# Patient Record
Sex: Female | Born: 1945 | Race: White | Hispanic: No | Smoking: Former smoker
Health system: Southern US, Community
[De-identification: ages and names within clinical notes are randomized; demographics above are authoritative.]

## PROBLEM LIST (undated history)

## (undated) ENCOUNTER — Ambulatory Visit: Admission: EM | Source: Home / Self Care

## (undated) DIAGNOSIS — M810 Age-related osteoporosis without current pathological fracture: Secondary | ICD-10-CM

## (undated) DIAGNOSIS — I471 Supraventricular tachycardia, unspecified: Secondary | ICD-10-CM

## (undated) DIAGNOSIS — H409 Unspecified glaucoma: Secondary | ICD-10-CM

## (undated) DIAGNOSIS — E785 Hyperlipidemia, unspecified: Secondary | ICD-10-CM

## (undated) HISTORY — PX: TUBAL LIGATION: SHX77

## (undated) HISTORY — PX: UMBILICAL HERNIA REPAIR: SHX196

## (undated) HISTORY — PX: ROTATOR CUFF REPAIR: SHX139

## (undated) HISTORY — PX: CHOLECYSTECTOMY: SHX55

---

## 2007-11-11 ENCOUNTER — Ambulatory Visit: Payer: Self-pay | Admitting: Gynecology

## 2009-01-29 ENCOUNTER — Ambulatory Visit: Payer: Self-pay | Admitting: Family Medicine

## 2010-10-24 ENCOUNTER — Ambulatory Visit: Payer: Self-pay | Admitting: Family Medicine

## 2012-08-09 ENCOUNTER — Ambulatory Visit: Payer: Self-pay | Admitting: Emergency Medicine

## 2013-03-15 ENCOUNTER — Ambulatory Visit: Payer: Self-pay

## 2014-07-17 ENCOUNTER — Ambulatory Visit: Payer: Self-pay

## 2015-04-02 ENCOUNTER — Ambulatory Visit
Admit: 2015-04-02 | Disposition: A | Discharge status: Home / Self Care | Payer: Self-pay | Attending: Family Medicine | Admitting: Family Medicine

## 2015-06-06 ENCOUNTER — Other Ambulatory Visit: Payer: Self-pay | Admitting: Family Medicine

## 2015-06-06 DIAGNOSIS — Z1231 Encounter for screening mammogram for malignant neoplasm of breast: Secondary | ICD-10-CM

## 2015-06-21 ENCOUNTER — Ambulatory Visit
Admission: RE | Admit: 2015-06-21 | Discharge: 2015-06-21 | Disposition: A | Discharge status: Home / Self Care | Payer: Medicare Other | Source: Ambulatory Visit | Attending: Family Medicine | Admitting: Family Medicine

## 2015-06-21 DIAGNOSIS — Z1231 Encounter for screening mammogram for malignant neoplasm of breast: Secondary | ICD-10-CM

## 2015-07-30 ENCOUNTER — Ambulatory Visit
Admission: EM | Admit: 2015-07-30 | Discharge: 2015-07-30 | Disposition: A | Discharge status: Home / Self Care | Payer: Medicare Other | Attending: Family Medicine | Admitting: Family Medicine

## 2015-07-30 ENCOUNTER — Ambulatory Visit: Payer: Medicare Other

## 2015-07-30 DIAGNOSIS — M7522 Bicipital tendinitis, left shoulder: Secondary | ICD-10-CM | POA: Diagnosis not present

## 2015-07-30 DIAGNOSIS — M79675 Pain in left toe(s): Secondary | ICD-10-CM | POA: Diagnosis present

## 2015-07-30 DIAGNOSIS — X58XXXA Exposure to other specified factors, initial encounter: Secondary | ICD-10-CM | POA: Insufficient documentation

## 2015-07-30 DIAGNOSIS — S46212A Strain of muscle, fascia and tendon of other parts of biceps, left arm, initial encounter: Secondary | ICD-10-CM | POA: Diagnosis not present

## 2015-07-30 DIAGNOSIS — E785 Hyperlipidemia, unspecified: Secondary | ICD-10-CM | POA: Insufficient documentation

## 2015-07-30 DIAGNOSIS — M25512 Pain in left shoulder: Secondary | ICD-10-CM

## 2015-07-30 DIAGNOSIS — H409 Unspecified glaucoma: Secondary | ICD-10-CM | POA: Diagnosis not present

## 2015-07-30 DIAGNOSIS — S46112A Strain of muscle, fascia and tendon of long head of biceps, left arm, initial encounter: Secondary | ICD-10-CM

## 2015-07-30 DIAGNOSIS — S93529A Sprain of metatarsophalangeal joint of unspecified toe(s), initial encounter: Secondary | ICD-10-CM | POA: Diagnosis not present

## 2015-07-30 DIAGNOSIS — M810 Age-related osteoporosis without current pathological fracture: Secondary | ICD-10-CM | POA: Insufficient documentation

## 2015-07-30 DIAGNOSIS — M79602 Pain in left arm: Secondary | ICD-10-CM | POA: Diagnosis present

## 2015-07-30 HISTORY — DX: Age-related osteoporosis without current pathological fracture: M81.0

## 2015-07-30 HISTORY — DX: Hyperlipidemia, unspecified: E78.5

## 2015-07-30 HISTORY — DX: Unspecified glaucoma: H40.9

## 2015-07-30 MED ORDER — NAPROXEN 500 MG PO TABS: 500.0000 mg | ORAL_TABLET | Freq: Two times a day (BID) | ORAL | Status: DC

## 2015-07-30 NOTE — Discharge Instructions (Signed)
Turf Toe, with Rehab °Injury to the base of the big toe (first metatarsal phalangeal joint) that causes damage to the joint capsule and ligaments is known as turf toe. Turf toe commonly occurs on the bottom side of the joint. °SYMPTOMS  °· Pain, tenderness, inflammation and/or bruising around the big toe (contusion). °· Pain that worsens with movement of the big toe, specifically when raising (extending) the toe. °· Inability to walk properly on the affected foot, which causes one to limp. °CAUSES  °Turf toe is caused by a force being placed on the joint capsule and ligaments that is greater than they can withstand. Common mechanisms of injury include: °· Repetitive and/or strenuous extension of the big toe (standing on tiptoes). °· Explosive running starts (sprinters). °· "Stubbing" the big toe. °· Another player landing on your foot. °RISK INCREASES WITH: °· Previous toe injury. °· Having a long first toe. °· Flat feet. °· Arthritis of the great toe. °· Improperly fitted shoes or shoes that are not appropriate for a given activity. °· Family history of foot abnormalities. °· Activities that involve explosive running starts, standing on tiptoes, or jumping. °PREVENTION °· Wear properly fitted shoes that are appropriate for the sport or activity. °· Protect the first toe by taping it to reduce motion. °· Maintain physical fitness: °¨ Strength, flexibility, and endurance. °¨ Cardiovascular fitness. °PROGNOSIS  °If treated properly, the symptoms of turf toe usually resolve with non-surgical (conservative) treatment. Occasionally, surgery is necessary. °RELATED COMPLICATIONS °· Recurrent symptoms that result in a chronic problem. °· Inability to compete in athletics. °· Prolonged healing time, if improperly treated or re-injured. °· Other foot injuries that occur due to protecting the first toe from pain. °· Loss of motion in the first toe (hallux rigidus). °· Bunion (hallux valgus). °TREATMENT  °Treatment initially  involves resting from any activities that aggravate the symptoms, and the use of ice and medications to help reduce pain and inflammation. The use of range-of-motion exercises may help reduce pain with activity. It is important that you wear properly fitted shoes with a stiff sole and a wide toe box, in order to reduce the pressure on the first toe. Protecting your big toe by taping it to restrict movement may allow you to return to sports earlier without pain or discomfort. If the condition becomes chronic, then your caregiver may recommend a corticosteroid injection to help reduce inflammation. If symptoms persist despite non-surgical treatment, then surgery may be recommended. °MEDICATION °· If pain medication is necessary, then nonsteroidal anti-inflammatory medications, such as aspirin and ibuprofen, or other minor pain relievers, such as acetaminophen, are often recommended. °· Do not take pain medication for 7 days before surgery. °· Prescription pain relievers may be given if deemed necessary by your caregiver. Use only as directed and only as much as you need. °· Ointments applied to the skin may be helpful. °· Corticosteroid injections may be given by your caregiver. These injections should be reserved for the most serious cases, because they may only be given a certain number of times. °HEAT AND COLD °· Cold treatment (icing) relieves pain and reduces inflammation. Cold treatment should be applied for 10 to 15 minutes every 2 to 3 hours for inflammation and pain and immediately after any activity that aggravates your symptoms. Use ice packs or massage the area with a piece of ice (ice massage). °· Heat treatment may be used prior to performing the stretching and strengthening activities prescribed by your caregiver, physical therapist, or athletic   trainer. Use a heat pack or soak the injury in warm water. SEEK MEDICAL CARE IF:  Treatment seems to offer no benefit, or the condition worsens.  Any  medications produce adverse side effects. EXERCISES RANGE OF MOTION (ROM) AND STRETCHING EXERCISES - Turf Toe These exercises may help you when beginning to rehabilitate your injury. Your symptoms may resolve with or without further involvement from your physician, physical therapist, or athletic trainer. While completing these exercises, remember:  Restoring tissue flexibility helps normal motion to return to the joints. This allows healthier, less painful movement and activity.  An effective stretch should be held for at least 30 seconds.  A stretch should never be painful. You should only feel a gentle lengthening or release in the stretched tissue. RANGE OF MOTION - Toe Extension, Flexion  Sit with your right / left leg crossed over your opposite knee.  Grasp your toes and gently pull them back toward the top of your foot. You should feel a stretch on the bottom of your toes and/or foot.  Hold this stretch for __________ seconds.  Now, gently pull your toes toward the bottom of your foot. You should feel a stretch on the top of your toes and or foot.  Hold this stretch for __________ seconds. Repeat __________ times. Complete this stretch __________ times per day. RANGE OF MOTION - Ankle Plantar Flexion  Sit with your right / left leg crossed over your opposite knee.  Use your opposite hand to pull the top of your foot and toes toward you.  You should feel a gentle stretch on the top of your foot/ankle. Hold this position for __________ seconds. Repeat __________ times. Complete __________ times per day. STRENGTHENING EXERCISES - Turf Toe These exercises may help you when beginning to rehabilitate your injury. They may resolve your symptoms with or without further involvement from your physician, physical therapist, or athletic trainer. While completing these exercises, remember:  Muscles can gain both the endurance and the strength needed for everyday activities through  controlled exercises.  Complete these exercises as instructed by your physician, physical therapist, or athletic trainer. Progress with the resistance and repetition exercises only as your caregiver advises.  You may experience muscle soreness or fatigue, but the pain or discomfort you are trying to eliminate should never worsen during these exercises. If this pain does worsen, stop and make certain you are following the directions exactly. If the pain is still present after adjustments, discontinue the exercise until you can discuss the trouble with your clinician. STRENGTH - Towel Curls  Sit in a chair positioned on a non-carpeted surface.  Place your foot on a towel, keeping your heel on the floor.  Pull the towel toward your heel by only curling your toes. Keep your heel on the floor.  If instructed by your physician, physical therapist or athletic trainer, add ____________________ at the end of the towel. Repeat __________ times. Complete this exercise __________ times per day. Document Released: 12/15/2005 Document Revised: 05/01/2014 Document Reviewed: 03/29/2009 [facility] Patient Information 2015 [place], [place]. This information is not intended to replace advice given to you by your health care provider. Make sure you discuss any questions you have with your health care provider. Impingement Syndrome, Rotator Cuff, Bursitis with Rehab Impingement syndrome is a condition that involves inflammation of the tendons of the rotator cuff and the subacromial bursa, that causes pain in the shoulder. The rotator cuff consists of four tendons and muscles that control much of the shoulder and  upper arm function. The subacromial bursa is a fluid filled sac that helps reduce friction between the rotator cuff and one of the bones of the shoulder (acromion). Impingement syndrome is usually an overuse injury that causes swelling of the bursa (bursitis), swelling of the tendon (tendonitis), and/or a tear of  the tendon (strain). Strains are classified into three categories. Grade 1 strains cause pain, but the tendon is not lengthened. Grade 2 strains include a lengthened ligament, due to the ligament being stretched or partially ruptured. With grade 2 strains there is still function, although the function may be decreased. Grade 3 strains include a complete tear of the tendon or muscle, and function is usually impaired. SYMPTOMS   Pain around the shoulder, often at the outer portion of the upper arm.  Pain that gets worse with shoulder function, especially when reaching overhead or lifting.  Sometimes, aching when not using the arm.  Pain that wakes you up at night.  Sometimes, tenderness, swelling, warmth, or redness over the affected area.  Loss of strength.  Limited motion of the shoulder, especially reaching behind the back (to the back pocket or to unhook bra) or across your body.  Crackling sound (crepitation) when moving the arm.  Biceps tendon pain and inflammation (in the front of the shoulder). Worse when bending the elbow or lifting. CAUSES  Impingement syndrome is often an overuse injury, in which chronic (repetitive) motions cause the tendons or bursa to become inflamed. A strain occurs when a force is paced on the tendon or muscle that is greater than it can withstand. Common mechanisms of injury include: Stress from sudden increase in duration, frequency, or intensity of training.  Direct hit (trauma) to the shoulder.  Aging, erosion of the tendon with normal use.  Bony bump on shoulder (acromial spur). RISK INCREASES WITH:  Contact sports (football, wrestling, boxing).  Throwing sports (baseball, tennis, volleyball).  Weightlifting and bodybuilding.  Heavy labor.  Previous injury to the rotator cuff, including impingement.  Poor shoulder strength and flexibility.  Failure to warm up properly before activity.  Inadequate protective equipment.  Old  age.  Bony bump on shoulder (acromial spur). PREVENTION   Warm up and stretch properly before activity.  Allow for adequate recovery between workouts.  Maintain physical fitness:  Strength, flexibility, and endurance.  Cardiovascular fitness.  Learn and use proper exercise technique. PROGNOSIS  If treated properly, impingement syndrome usually goes away within 6 weeks. Sometimes surgery is required.  RELATED COMPLICATIONS   Longer healing time if not properly treated, or if not given enough time to heal.  Recurring symptoms, that result in a chronic condition.  Shoulder stiffness, frozen shoulder, or loss of motion.  Rotator cuff tendon tear.  Recurring symptoms, especially if activity is resumed too soon, with overuse, with a direct blow, or when using poor technique. TREATMENT  Treatment first involves the use of ice and medicine, to reduce pain and inflammation. The use of strengthening and stretching exercises may help reduce pain with activity. These exercises may be performed at home or with a therapist. If non-surgical treatment is unsuccessful after more than 6 months, surgery may be advised. After surgery and rehabilitation, activity is usually possible in 3 months.  MEDICATION  If pain medicine is needed, nonsteroidal anti-inflammatory medicines (aspirin and ibuprofen), or other minor pain relievers (acetaminophen), are often advised.  Do not take pain medicine for 7 days before surgery.  Prescription pain relievers may be given, if your caregiver thinks they are  needed. Use only as directed and only as much as you need.  Corticosteroid injections may be given by your caregiver. These injections should be reserved for the most serious cases, because they may only be given a certain number of times. HEAT AND COLD  Cold treatment (icing) should be applied for 10 to 15 minutes every 2 to 3 hours for inflammation and pain, and immediately after activity that aggravates  your symptoms. Use ice packs or an ice massage.  Heat treatment may be used before performing stretching and strengthening activities prescribed by your caregiver, physical therapist, or athletic trainer. Use a heat pack or a warm water soak. SEEK MEDICAL CARE IF:   Symptoms get worse or do not improve in 4 to 6 weeks, despite treatment.  New, unexplained symptoms develop. (Drugs used in treatment may produce side effects.) EXERCISES  RANGE OF MOTION (ROM) AND STRETCHING EXERCISES - Impingement Syndrome (Rotator Cuff  Tendinitis, Bursitis) These exercises may help you when beginning to rehabilitate your injury. Your symptoms may go away with or without further involvement from your physician, physical therapist or athletic trainer. While completing these exercises, remember:   Restoring tissue flexibility helps normal motion to return to the joints. This allows healthier, less painful movement and activity.  An effective stretch should be held for at least 30 seconds.  A stretch should never be painful. You should only feel a gentle lengthening or release in the stretched tissue. STRETCH - Flexion, Standing  Stand with good posture. With an underhand grip on your right / left hand, and an overhand grip on the opposite hand, grasp a broomstick or cane so that your hands are a little more than shoulder width apart.  Keeping your right / left elbow straight and shoulder muscles relaxed, push the stick with your opposite hand, to raise your right / left arm in front of your body and then overhead. Raise your arm until you feel a stretch in your right / left shoulder, but before you have increased shoulder pain.  Try to avoid shrugging your right / left shoulder as your arm rises, by keeping your shoulder blade tucked down and toward your mid-back spine. Hold for __________ seconds.  Slowly return to the starting position. Repeat __________ times. Complete this exercise __________ times per  day. STRETCH - Abduction, Supine  Lie on your back. With an underhand grip on your right / left hand and an overhand grip on the opposite hand, grasp a broomstick or cane so that your hands are a little more than shoulder width apart.  Keeping your right / left elbow straight and your shoulder muscles relaxed, push the stick with your opposite hand, to raise your right / left arm out to the side of your body and then overhead. Raise your arm until you feel a stretch in your right / left shoulder, but before you have increased shoulder pain.  Try to avoid shrugging your right / left shoulder as your arm rises, by keeping your shoulder blade tucked down and toward your mid-back spine. Hold for __________ seconds.  Slowly return to the starting position. Repeat __________ times. Complete this exercise __________ times per day. ROM - Flexion, Active-Assisted  Lie on your back. You may bend your knees for comfort.  Grasp a broomstick or cane so your hands are about shoulder width apart. Your right / left hand should grip the end of the stick, so that your hand is positioned "thumbs-up," as if you were about to  shake hands.  Using your healthy arm to lead, raise your right / left arm overhead, until you feel a gentle stretch in your shoulder. Hold for __________ seconds.  Use the stick to assist in returning your right / left arm to its starting position. Repeat __________ times. Complete this exercise __________ times per day.  ROM - Internal Rotation, Supine   Lie on your back on a firm surface. Place your right / left elbow about 60 degrees away from your side. Elevate your elbow with a folded towel, so that the elbow and shoulder are the same height.  Using a broomstick or cane and your strong arm, pull your right / left hand toward your body until you feel a gentle stretch, but no increase in your shoulder pain. Keep your shoulder and elbow in place throughout the exercise.  Hold for  __________ seconds. Slowly return to the starting position. Repeat __________ times. Complete this exercise __________ times per day. STRETCH - Internal Rotation  Place your right / left hand behind your back, palm up.  Throw a towel or belt over your opposite shoulder. Grasp the towel with your right / left hand.  While keeping an upright posture, gently pull up on the towel, until you feel a stretch in the front of your right / left shoulder.  Avoid shrugging your right / left shoulder as your arm rises, by keeping your shoulder blade tucked down and toward your mid-back spine.  Hold for __________ seconds. Release the stretch, by lowering your healthy hand. Repeat __________ times. Complete this exercise __________ times per day. ROM - Internal Rotation   Using an underhand grip, grasp a stick behind your back with both hands.  While standing upright with good posture, slide the stick up your back until you feel a mild stretch in the front of your shoulder.  Hold for __________ seconds. Slowly return to your starting position. Repeat __________ times. Complete this exercise __________ times per day.  STRETCH - Posterior Shoulder Capsule   Stand or sit with good posture. Grasp your right / left elbow and draw it across your chest, keeping it at the same height as your shoulder.  Pull your elbow, so your upper arm comes in closer to your chest. Pull until you feel a gentle stretch in the back of your shoulder.  Hold for __________ seconds. Repeat __________ times. Complete this exercise __________ times per day. STRENGTHENING EXERCISES - Impingement Syndrome (Rotator Cuff Tendinitis, Bursitis) These exercises may help you when beginning to rehabilitate your injury. They may resolve your symptoms with or without further involvement from your physician, physical therapist or athletic trainer. While completing these exercises, remember:  Muscles can gain both the endurance and the  strength needed for everyday activities through controlled exercises.  Complete these exercises as instructed by your physician, physical therapist or athletic trainer. Increase the resistance and repetitions only as guided.  You may experience muscle soreness or fatigue, but the pain or discomfort you are trying to eliminate should never worsen during these exercises. If this pain does get worse, stop and make sure you are following the directions exactly. If the pain is still present after adjustments, discontinue the exercise until you can discuss the trouble with your clinician.  During your recovery, avoid activity or exercises which involve actions that place your injured hand or elbow above your head or behind your back or head. These positions stress the tissues which you are trying to heal. STRENGTH - Scapular  Depression and Adduction   With good posture, sit on a firm chair. Support your arms in front of you, with pillows, arm rests, or on a table top. Have your elbows in line with the sides of your body.  Gently draw your shoulder blades down and toward your mid-back spine. Gradually increase the tension, without tensing the muscles along the top of your shoulders and the back of your neck.  Hold for __________ seconds. Slowly release the tension and relax your muscles completely before starting the next repetition.  After you have practiced this exercise, remove the arm support and complete the exercise in standing as well as sitting position. Repeat __________ times. Complete this exercise __________ times per day.  STRENGTH - Shoulder Abductors, Isometric  With good posture, stand or sit about 4-6 inches from a wall, with your right / left side facing the wall.  Bend your right / left elbow. Gently press your right / left elbow into the wall. Increase the pressure gradually, until you are pressing as hard as you can, without shrugging your shoulder or increasing any shoulder  discomfort.  Hold for __________ seconds.  Release the tension slowly. Relax your shoulder muscles completely before you begin the next repetition. Repeat __________ times. Complete this exercise __________ times per day.  STRENGTH - External Rotators, Isometric  Keep your right / left elbow at your side and bend it 90 degrees.  Step into a door frame so that the outside of your right / left wrist can press against the door frame without your upper arm leaving your side.  Gently press your right / left wrist into the door frame, as if you were trying to swing the back of your hand away from your stomach. Gradually increase the tension, until you are pressing as hard as you can, without shrugging your shoulder or increasing any shoulder discomfort.  Hold for __________ seconds.  Release the tension slowly. Relax your shoulder muscles completely before you begin the next repetition. Repeat __________ times. Complete this exercise __________ times per day.  STRENGTH - Supraspinatus   Stand or sit with good posture. Grasp a __________ weight, or an exercise band or tubing, so that your hand is "thumbs-up," like you are shaking hands.  Slowly lift your right / left arm in a "V" away from your thigh, diagonally into the space between your side and straight ahead. Lift your hand to shoulder height or as far as you can, without increasing any shoulder pain. At first, many people do not lift their hands above shoulder height.  Avoid shrugging your right / left shoulder as your arm rises, by keeping your shoulder blade tucked down and toward your mid-back spine.  Hold for __________ seconds. Control the descent of your hand, as you slowly return to your starting position. Repeat __________ times. Complete this exercise __________ times per day.  STRENGTH - External Rotators  Secure a rubber exercise band or tubing to a fixed object (table, pole) so that it is at the same height as your right /  left elbow when you are standing or sitting on a firm surface.  Stand or sit so that the secured exercise band is at your uninjured side.  Bend your right / left elbow 90 degrees. Place a folded towel or small pillow under your right / left arm, so that your elbow is a few inches away from your side.  Keeping the tension on the exercise band, pull it away from your body,  as if pivoting on your elbow. Be sure to keep your body steady, so that the movement is coming only from your rotating shoulder.  Hold for __________ seconds. Release the tension in a controlled manner, as you return to the starting position. Repeat __________ times. Complete this exercise __________ times per day.  STRENGTH - Internal Rotators   Secure a rubber exercise band or tubing to a fixed object (table, pole) so that it is at the same height as your right / left elbow when you are standing or sitting on a firm surface.  Stand or sit so that the secured exercise band is at your right / left side.  Bend your elbow 90 degrees. Place a folded towel or small pillow under your right / left arm so that your elbow is a few inches away from your side.  Keeping the tension on the exercise band, pull it across your body, toward your stomach. Be sure to keep your body steady, so that the movement is coming only from your rotating shoulder.  Hold for __________ seconds. Release the tension in a controlled manner, as you return to the starting position. Repeat __________ times. Complete this exercise __________ times per day.  STRENGTH - Scapular Protractors, Standing   Stand arms length away from a wall. Place your hands on the wall, keeping your elbows straight.  Begin by dropping your shoulder blades down and toward your mid-back spine.  To strengthen your protractors, keep your shoulder blades down, but slide them forward on your rib cage. It will feel as if you are lifting the back of your rib cage away from the wall. This  is a subtle motion and can be challenging to complete. Ask your caregiver for further instruction, if you are not sure you are doing the exercise correctly.  Hold for __________ seconds. Slowly return to the starting position, resting the muscles completely before starting the next repetition. Repeat __________ times. Complete this exercise __________ times per day. STRENGTH - Scapular Protractors, Supine  Lie on your back on a firm surface. Extend your right / left arm straight into the air while holding a __________ weight in your hand.  Keeping your head and back in place, lift your shoulder off the floor.  Hold for __________ seconds. Slowly return to the starting position, and allow your muscles to relax completely before starting the next repetition. Repeat __________ times. Complete this exercise __________ times per day. STRENGTH - Scapular Protractors, Quadruped  Get onto your hands and knees, with your shoulders directly over your hands (or as close as you can be, comfortably).  Keeping your elbows locked, lift the back of your rib cage up into your shoulder blades, so your mid-back rounds out. Keep your neck muscles relaxed.  Hold this position for __________ seconds. Slowly return to the starting position and allow your muscles to relax completely before starting the next repetition. Repeat __________ times. Complete this exercise __________ times per day.  STRENGTH - Scapular Retractors  Secure a rubber exercise band or tubing to a fixed object (table, pole), so that it is at the height of your shoulders when you are either standing, or sitting on a firm armless chair.  With a palm down grip, grasp an end of the band in each hand. Straighten your elbows and lift your hands straight in front of you, at shoulder height. Step back, away from the secured end of the band, until it becomes tense.  Squeezing your shoulder blades together,  draw your elbows back toward your sides, as  you bend them. Keep your upper arms lifted away from your body throughout the exercise.  Hold for __________ seconds. Slowly ease the tension on the band, as you reverse the directions and return to the starting position. Repeat __________ times. Complete this exercise __________ times per day. STRENGTH - Shoulder Extensors   Secure a rubber exercise band or tubing to a fixed object (table, pole) so that it is at the height of your shoulders when you are either standing, or sitting on a firm armless chair.  With a thumbs-up grip, grasp an end of the band in each hand. Straighten your elbows and lift your hands straight in front of you, at shoulder height. Step back, away from the secured end of the band, until it becomes tense.  Squeezing your shoulder blades together, pull your hands down to the sides of your thighs. Do not allow your hands to go behind you.  Hold for __________ seconds. Slowly ease the tension on the band, as you reverse the directions and return to the starting position. Repeat __________ times. Complete this exercise __________ times per day.  STRENGTH - Scapular Retractors and External Rotators   Secure a rubber exercise band or tubing to a fixed object (table, pole) so that it is at the height as your shoulders, when you are either standing, or sitting on a firm armless chair.  With a palm down grip, grasp an end of the band in each hand. Bend your elbows 90 degrees and lift your elbows to shoulder height, at your sides. Step back, away from the secured end of the band, until it becomes tense.  Squeezing your shoulder blades together, rotate your shoulders so that your upper arms and elbows remain stationary, but your fists travel upward to head height.  Hold for __________ seconds. Slowly ease the tension on the band, as you reverse the directions and return to the starting position. Repeat __________ times. Complete this exercise __________ times per day.  STRENGTH -  Scapular Retractors and External Rotators, Rowing   Secure a rubber exercise band or tubing to a fixed object (table, pole) so that it is at the height of your shoulders, when you are either standing, or sitting on a firm armless chair.  With a palm down grip, grasp an end of the band in each hand. Straighten your elbows and lift your hands straight in front of you, at shoulder height. Step back, away from the secured end of the band, until it becomes tense.  Step 1: Squeeze your shoulder blades together. Bending your elbows, draw your hands to your chest, as if you are rowing a boat. At the end of this motion, your hands and elbow should be at shoulder height and your elbows should be out to your sides.  Step 2: Rotate your shoulders, to raise your hands above your head. Your forearms should be vertical and your upper arms should be horizontal.  Hold for __________ seconds. Slowly ease the tension on the band, as you reverse the directions and return to the starting position. Repeat __________ times. Complete this exercise __________ times per day.  STRENGTH - Scapular Depressors  Find a sturdy chair without wheels, such as a dining room chair.  Keeping your feet on the floor, and your hands on the chair arms, lift your bottom up from the seat, and lock your elbows.  Keeping your elbows straight, allow gravity to pull your body weight down.  Your shoulders will rise toward your ears.  Raise your body against gravity by drawing your shoulder blades down your back, shortening the distance between your shoulders and ears. Although your feet should always maintain contact with the floor, your feet should progressively support less body weight, as you get stronger.  Hold for __________ seconds. In a controlled and slow manner, lower your body weight to begin the next repetition. Repeat __________ times. Complete this exercise __________ times per day.  Document Released: 12/15/2005 Document  Revised: 03/08/2012 Document Reviewed: 03/29/2009 [facility] Patient Information 2015 [place], [place]. This information is not intended to replace advice given to you by your health care provider. Make sure you discuss any questions you have with your health care provider. Shoulder Pain The shoulder is the joint that connects your arms to your body. The bones that form the shoulder joint include the upper arm bone (humerus), the shoulder blade (scapula), and the collarbone (clavicle). The top of the humerus is shaped like a ball and fits into a rather flat socket on the scapula (glenoid cavity). A combination of muscles and strong, fibrous tissues that connect muscles to bones (tendons) support your shoulder joint and hold the ball in the socket. Small, fluid-filled sacs (bursae) are located in different areas of the joint. They act as cushions between the bones and the overlying soft tissues and help reduce friction between the gliding tendons and the bone as you move your arm. Your shoulder joint allows a wide range of motion in your arm. This range of motion allows you to do things like scratch your back or throw a ball. However, this range of motion also makes your shoulder more prone to pain from overuse and injury. Causes of shoulder pain can originate from both injury and overuse and usually can be grouped in the following four categories:  Redness, swelling, and pain (inflammation) of the tendon (tendinitis) or the bursae (bursitis).  Instability, such as a dislocation of the joint.  Inflammation of the joint (arthritis).  Broken bone (fracture). HOME CARE INSTRUCTIONS   Apply ice to the sore area.  Put ice in a plastic bag.  Place a towel between your skin and the bag.  Leave the ice on for 15-20 minutes, 3-4 times per day for the first 2 days, or as directed by your health care provider.  Stop using cold packs if they do not help with the pain.  If you have a shoulder sling or  immobilizer, wear it as long as your caregiver instructs. Only remove it to shower or bathe. Move your arm as little as possible, but keep your hand moving to prevent swelling.  Squeeze a soft ball or foam pad as much as possible to help prevent swelling.  Only take over-the-counter or prescription medicines for pain, discomfort, or fever as directed by your caregiver. SEEK MEDICAL CARE IF:   Your shoulder pain increases, or new pain develops in your arm, hand, or fingers.  Your hand or fingers become cold and numb.  Your pain is not relieved with medicines. SEEK IMMEDIATE MEDICAL CARE IF:   Your arm, hand, or fingers are numb or tingling.  Your arm, hand, or fingers are significantly swollen or turn white or blue. MAKE SURE YOU:   Understand these instructions.  Will watch your condition.  Will get help right away if you are not doing well or get worse. Document Released: 09/24/2005 Document Revised: 05/01/2014 Document Reviewed: 11/29/2011 [facility] Patient Information 2015 [place], [place]. This information  is not intended to replace advice given to you by your health care provider. Make sure you discuss any questions you have with your health care provider. Bicipital Tendonitis Bicipital tendonitis refers to redness, soreness, and swelling (inflammation) or irritation of the bicep tendon. The biceps muscle is located between the elbow and shoulder of the inner arm. The tendon heads, similar to pieces of rope, connect the bicep muscle to the shoulder socket. They are called short head and long head tendons. When tendonitis occurs, the long head tendon is inflamed and swollen, and may be thickened or partially torn.  Bicipital tendonitis can occur with other problems as well, such as arthritis in the shoulder or acromioclavicular joints, tears in the tendons, or other rotator cuff problems.  CAUSES  Overuse of of the arms for overhead activities is the major cause of tendonitis. Many  athletes, such as swimmers, baseball players, and tennis players are prone to bicipital tendonitis. Jobs that require manual labor or routine chores, especially chores involving overhead activities can result in overuse and tendonitis. SYMPTOMS Symptoms may include:  Pain in and around the front of the shoulder. Pain may be worse with overhead motion.  Pain or aching that radiates down the arm.  Clicking or shifting sensations in the shoulder. DIAGNOSIS Your caregiver may perform the following:  Physical exam and tests of the biceps and shoulder to observe range of motion, strength, and stability.  X-rays or magnetic resonance imaging (MRI) to confirm the diagnosis. In most common cases, these tests are not necessary. Since other problems may exist in the shoulder or rotator cuff, additional tests may be recommended. TREATMENT Treatment may include the following:  Medications  Your caregiver may prescribe over-the-counter pain relievers.  Steroid injections, such as cortisone, may be recommended. These may help to reduce inflammation and pain.  Physical Therapy - Your caregiver may recommend gentle exercises with the arm. These can help restore strength and range of motion. They may be done at home or with a physical therapist's supervision and input.  Surgery - Arthroscopic or open surgery sometimes is necessary. Surgery may include:  Reattachment or repair of the tendon at the shoulder socket.  Removal of the damaged section of the tendon.  Anchoring the tendon to a different area of the shoulder (tenodesis). HOME CARE INSTRUCTIONS   Avoid overhead motion of the affected arm or any other motion that causes pain.  Take medication for pain as directed. Do not take these for more than 3 weeks, unless directed to do so by your caregiver.  Ice the affected area for 20 minutes at a time, 3-4 times per day. Place a towel on the skin over the painful area and the ice or cold pack  over the towel. Do not place ice directly on the skin.  Perform gentle exercises at home as directed. These will increase strength and flexibility. PREVENTION  Modify your activities as much as possible to protect your arm. A physical therapist or sports medicine physician can help you understand options for safe motion.  Avoid repetitive overhead pulling, lifting, reaching, and throwing until your caregiver tells you it is ok to resume these activities. SEEK MEDICAL CARE IF:  Your pain worsens.  You have difficulty moving the affected arm.  You have trouble performing any of the self-care instructions. MAKE SURE YOU:   Understand these instructions.  Will watch your condition.  Will get help right away if you are not doing well or get worse. Document Released: 01/17/2011  Document Revised: 03/08/2012 Document Reviewed: 01/17/2011 [facility] Patient Information 2015 [place], [place]. This information is not intended to replace advice given to you by your health care provider. Make sure you discuss any questions you have with your health care provider. Turf Toe, with Rehab Injury to the base of the big toe (first metatarsal phalangeal joint) that causes damage to the joint capsule and ligaments is known as turf toe. Turf toe commonly occurs on the bottom side of the joint. SYMPTOMS   Pain, tenderness, inflammation and/or bruising around the big toe (contusion).  Pain that worsens with movement of the big toe, specifically when raising (extending) the toe.  Inability to walk properly on the affected foot, which causes one to limp. CAUSES  Turf toe is caused by a force being placed on the joint capsule and ligaments that is greater than they can withstand. Common mechanisms of injury include:  Repetitive and/or strenuous extension of the big toe (standing on tiptoes).  Explosive running starts (sprinters).  "Stubbing" the big toe.  Another player landing on your foot. RISK  INCREASES WITH:  Previous toe injury.  Having a long first toe.  Flat feet.  Arthritis of the great toe.  Improperly fitted shoes or shoes that are not appropriate for a given activity.  Family history of foot abnormalities.  Activities that involve explosive running starts, standing on tiptoes, or jumping. PREVENTION  Wear properly fitted shoes that are appropriate for the sport or activity.  Protect the first toe by taping it to reduce motion.  Maintain physical fitness:  Strength, flexibility, and endurance.  Cardiovascular fitness. PROGNOSIS  If treated properly, the symptoms of turf toe usually resolve with non-surgical (conservative) treatment. Occasionally, surgery is necessary. RELATED COMPLICATIONS  Recurrent symptoms that result in a chronic problem.  Inability to compete in athletics.  Prolonged healing time, if improperly treated or re-injured.  Other foot injuries that occur due to protecting the first toe from pain.  Loss of motion in the first toe (hallux rigidus).  Bunion (hallux valgus). TREATMENT  Treatment initially involves resting from any activities that aggravate the symptoms, and the use of ice and medications to help reduce pain and inflammation. The use of range-of-motion exercises may help reduce pain with activity. It is important that you wear properly fitted shoes with a stiff sole and a wide toe box, in order to reduce the pressure on the first toe. Protecting your big toe by taping it to restrict movement may allow you to return to sports earlier without pain or discomfort. If the condition becomes chronic, then your caregiver may recommend a corticosteroid injection to help reduce inflammation. If symptoms persist despite non-surgical treatment, then surgery may be recommended. MEDICATION  If pain medication is necessary, then nonsteroidal anti-inflammatory medications, such as aspirin and ibuprofen, or other minor pain relievers, such as  acetaminophen, are often recommended.  Do not take pain medication for 7 days before surgery.  Prescription pain relievers may be given if deemed necessary by your caregiver. Use only as directed and only as much as you need.  Ointments applied to the skin may be helpful.  Corticosteroid injections may be given by your caregiver. These injections should be reserved for the most serious cases, because they may only be given a certain number of times. HEAT AND COLD  Cold treatment (icing) relieves pain and reduces inflammation. Cold treatment should be applied for 10 to 15 minutes every 2 to 3 hours for inflammation and pain and immediately after  any activity that aggravates your symptoms. Use ice packs or massage the area with a piece of ice (ice massage).  Heat treatment may be used prior to performing the stretching and strengthening activities prescribed by your caregiver, physical therapist, or athletic trainer. Use a heat pack or soak the injury in warm water. SEEK MEDICAL CARE IF:  Treatment seems to offer no benefit, or the condition worsens.  Any medications produce adverse side effects. EXERCISES RANGE OF MOTION (ROM) AND STRETCHING EXERCISES - Turf Toe These exercises may help you when beginning to rehabilitate your injury. Your symptoms may resolve with or without further involvement from your physician, physical therapist, or athletic trainer. While completing these exercises, remember:  Restoring tissue flexibility helps normal motion to return to the joints. This allows healthier, less painful movement and activity.  An effective stretch should be held for at least 30 seconds.  A stretch should never be painful. You should only feel a gentle lengthening or release in the stretched tissue. RANGE OF MOTION - Toe Extension, Flexion  Sit with your right / left leg crossed over your opposite knee.  Grasp your toes and gently pull them back toward the top of your foot. You  should feel a stretch on the bottom of your toes and/or foot.  Hold this stretch for __________ seconds.  Now, gently pull your toes toward the bottom of your foot. You should feel a stretch on the top of your toes and or foot.  Hold this stretch for __________ seconds. Repeat __________ times. Complete this stretch __________ times per day. RANGE OF MOTION - Ankle Plantar Flexion  Sit with your right / left leg crossed over your opposite knee.  Use your opposite hand to pull the top of your foot and toes toward you.  You should feel a gentle stretch on the top of your foot/ankle. Hold this position for __________ seconds. Repeat __________ times. Complete __________ times per day. STRENGTHENING EXERCISES - Turf Toe These exercises may help you when beginning to rehabilitate your injury. They may resolve your symptoms with or without further involvement from your physician, physical therapist, or athletic trainer. While completing these exercises, remember:  Muscles can gain both the endurance and the strength needed for everyday activities through controlled exercises.  Complete these exercises as instructed by your physician, physical therapist, or athletic trainer. Progress with the resistance and repetition exercises only as your caregiver advises.  You may experience muscle soreness or fatigue, but the pain or discomfort you are trying to eliminate should never worsen during these exercises. If this pain does worsen, stop and make certain you are following the directions exactly. If the pain is still present after adjustments, discontinue the exercise until you can discuss the trouble with your clinician. STRENGTH - Towel Curls  Sit in a chair positioned on a non-carpeted surface.  Place your foot on a towel, keeping your heel on the floor.  Pull the towel toward your heel by only curling your toes. Keep your heel on the floor.  If instructed by your physician, physical therapist  or athletic trainer, add ____________________ at the end of the towel. Repeat __________ times. Complete this exercise __________ times per day. Document Released: 12/15/2005 Document Revised: 05/01/2014 Document Reviewed: 03/29/2009 [facility] Patient Information 2015 [place], [place]. This information is not intended to replace advice given to you by your health care provider. Make sure you discuss any questions you have with your health care provider.  PrintEmail  Patient information: Osteoporosis (The Basics)       View All  Foods and drinks with calcium and vitamin D  Foods and drinks with calcium   The Basics  Patient information: Calcium and vitamin D for bone health (The Basics) Patient information: Fractures (The Basics) Patient information: Hip fracture (The Basics) Patient information: Medicines for osteoporosis (The Basics) Patient information: Vertebral compression fracture (The Basics) Beyond the Basics  Patient information: Bone density testing (Beyond the Basics) Patient information: Calcium and vitamin D for bone health (Beyond the Basics) Patient information: Osteoporosis prevention and treatment (Beyond the Basics) Patient information: Primary hyperparathyroidism (Beyond the Basics) Patient information: Vitamin D deficiency (Beyond the Basics) Patient information: Osteoporosis (The Basics) View in Spanish  Written by the doctors and editors at [employer]  What is osteoporosis? -- Osteoporosis is a disease that makes your bones weak. People with the disease can break their bones too easily. For instance, people with osteoporosis sometimes break a bone after falling down at home.  Breaking a bone can be serious, especially if the bone is in the hip. People who break a hip sometimes lose the ability to walk on their own. Many of them end up in a nursing home. Thats why it is so important to avoid breaking a bone in the first place.  How do I know if I have  osteoporosis? -- Osteoporosis does not cause symptoms until you break a bone. But your doctor or nurse can have you tested for it. The best test is a bone density test called the DXA test. It is a special kind of X-ray. Experts recommend bone density testing for women older than 65. That is because women in this age group have the highest risk of osteoporosis. Still, other people should sometimes be tested, too. Ask your doctor or nurse if you should be tested.  Some people learn that they have osteoporosis because they break a bone during a fall or a mild impact. This is called a fragility fracture, because people with healthy bones should not break a bone that easily. People who have fragility fractures are at high risk of having other bones break.  What can I do to keep my bones as healthy as possible? -- You can: ?Eat foods with a lot of calcium, such as milk, yogurt, and green leafy vegetables (table 1 and figure 1) ?Eat foods with a lot of vitamin D, such as milk that has vitamin D added, and fish from the ocean  ?Take calcium and vitamin D pills (if you do not get enough from the food that you eat) ?Be active for at least 30 minutes, most days of the week ?Avoid smoking  ?Limit the amount of alcohol you drink to 1 to 2 drinks a day at most Do your best to keep from falling, too -- It sounds simple, but you can prevent a lot of fractures by reducing the chances of a fall. To do that: ?Make sure all your rugs have a no-slip backing to keep them in place ?Tuck away any electrical cords, so they are not in your way ?Light all walkways well ?Watch out for slippery floors ?Wear sturdy, comfortable shoes with rubber soles  ?Have your eyes checked ?Ask your doctor or nurse to check whether any of your medicines might make you dizzy or increase your risk of falling  Can osteoporosis be treated? -- Yes, there are a few medicines to treat osteoporosis. These medicines can reduce the chances that  you  will break a bone.  Doctors and nurses usually suggest trying medicines called bisphosphonates first. If those medicines do not do enough or if they cause side effects that you cannot stand, there are other medicines to try. How will I know the treatment is working? -- Chiropractor and nurses often order bone density tests to check if osteoporosis medicines are working. These are the same tests they use to find osteoporosis in the first place. Sometimes a blood or urine test is also needed. PrintEmail     Patient information: Osteoporosis (The Basics)       View All  Foods and drinks with calcium and vitamin D  Foods and drinks with calcium   The Basics  Patient information: Calcium and vitamin D for bone health (The Basics) Patient information: Fractures (The Basics) Patient information: Hip fracture (The Basics) Patient information: Medicines for osteoporosis (The Basics) Patient information: Vertebral compression fracture (The Basics) Beyond the Basics  Patient information: Bone density testing (Beyond the Basics) Patient information: Calcium and vitamin D for bone health (Beyond the Basics) Patient information: Osteoporosis prevention and treatment (Beyond the Basics) Patient information: Primary hyperparathyroidism (Beyond the Basics) Patient information: Vitamin D deficiency (Beyond the Basics) Patient information: Osteoporosis (The Basics) View in Spanish  Written by the doctors and editors at [employer]  What is osteoporosis? -- Osteoporosis is a disease that makes your bones weak. People with the disease can break their bones too easily. For instance, people with osteoporosis sometimes break a bone after falling down at home.  Breaking a bone can be serious, especially if the bone is in the hip. People who break a hip sometimes lose the ability to walk on their own. Many of them end up in a nursing home. Thats why it is so important to avoid breaking a bone in the first place.   How do I know if I have osteoporosis? -- Osteoporosis does not cause symptoms until you break a bone. But your doctor or nurse can have you tested for it. The best test is a bone density test called the DXA test. It is a special kind of X-ray. Experts recommend bone density testing for women older than 65. That is because women in this age group have the highest risk of osteoporosis. Still, other people should sometimes be tested, too. Ask your doctor or nurse if you should be tested.  Some people learn that they have osteoporosis because they break a bone during a fall or a mild impact. This is called a fragility fracture, because people with healthy bones should not break a bone that easily. People who have fragility fractures are at high risk of having other bones break.  What can I do to keep my bones as healthy as possible? -- You can: ?Eat foods with a lot of calcium, such as milk, yogurt, and green leafy vegetables (table 1 and figure 1) ?Eat foods with a lot of vitamin D, such as milk that has vitamin D added, and fish from the ocean  ?Take calcium and vitamin D pills (if you do not get enough from the food that you eat) ?Be active for at least 30 minutes, most days of the week ?Avoid smoking  ?Limit the amount of alcohol you drink to 1 to 2 drinks a day at most Do your best to keep from falling, too -- It sounds simple, but you can prevent a lot of fractures by reducing the chances of a fall. To do that: ?Make sure all  your rugs have a no-slip backing to keep them in place ?Tuck away any electrical cords, so they are not in your way ?Light all walkways well ?Watch out for slippery floors ?Wear sturdy, comfortable shoes with rubber soles  ?Have your eyes checked ?Ask your doctor or nurse to check whether any of your medicines might make you dizzy or increase your risk of falling  Can osteoporosis be treated? -- Yes, there are a few medicines to treat osteoporosis. These medicines can  reduce the chances that you will break a bone.  Doctors and nurses usually suggest trying medicines called bisphosphonates first. If those medicines do not do enough or if they cause side effects that you cannot stand, there are other medicines to try. How will I know the treatment is working? -- Chiropractor and nurses often order bone density tests to check if osteoporosis medicines are working. These are the same tests they use to find osteoporosis in the first place. Sometimes a blood or urine test is also needed.

## 2015-07-30 NOTE — ED Provider Notes (Addendum)
CSN: [id number]     Arrival date & time 07/30/15  0900 History   First MD Initiated Contact with Patient 07/30/15 0915     Chief Complaint  Patient presents with  . Toe Pain  . Arm Pain   (Consider location/radiation/quality/duration/timing/severity/associated sxs/prior Treatment) HPI Comments: Widowed retired [employer] fund[employer] here for evaluation of great left toe pain after running into cinderblock while gardening yesterday.  Swollen last night elevated, iced took 2 aleve last night and 1 this am.  Worsening pain with weight bearing, bruised. Slept ok.  Mild swelling today. Hx osteoporosis.  Left shoulder biceps hurting also has been pulling weeds/flowers/vegetables--increased time in flower beds and gardens since spouse suddenly died in his sleep 6 months ago.  Patient denied SI/HI.  Right hand dominant history of rotator cuff repair surgery right shoulder.    The history is provided by the patient.    Past Medical History  Diagnosis Date  . Hyperlipidemia   . Glaucoma   . Osteoporosis    Past Surgical History  Procedure Laterality Date  . Umbilical hernia repair    . Tubal ligation    . Cholecystectomy    . Rotator cuff repair Right    History reviewed. No pertinent family history. History  Substance Use Topics  . Smoking status: Never Smoker   . Smokeless tobacco: Not on file  . Alcohol Use: 0.0 oz/week    0 Standard drinks or equivalent per week     Comment: occasionally   OB History    No data available     Review of Systems  Constitutional: Negative for fever, chills, diaphoresis, activity change, appetite change and fatigue.  HENT: Negative for congestion, dental problem, drooling, ear discharge, ear pain and facial swelling.   Eyes: Negative for photophobia, pain, discharge, redness, itching and visual disturbance.  Respiratory: Negative for cough, shortness of breath, wheezing and stridor.   Cardiovascular: Negative for chest pain and leg swelling.  Gastrointestinal: Negative  for nausea, vomiting, diarrhea and constipation.  Endocrine: Negative for cold intolerance and heat intolerance.  Genitourinary: Negative for dysuria and hematuria.  Musculoskeletal: Positive for myalgias, joint swelling and gait problem. Negative for back pain, arthralgias, neck pain and neck stiffness.  Skin: Positive for color change. Negative for pallor, rash and wound.  Allergic/Immunologic: Negative for environmental allergies and food allergies.  Neurological: Negative for dizziness, tremors, syncope, facial asymmetry, weakness, light-headedness and headaches.  Hematological: Negative for adenopathy. Does not bruise/bleed easily.  Psychiatric/Behavioral: Negative for behavioral problems, confusion, sleep disturbance and agitation.    Allergies  Review of patient's allergies indicates no known allergies.  Home Medications   Prior to Admission medications   Medication Sig Start Date End Date Taking? Authorizing Provider  aspirin 81 MG tablet Take 81 mg by mouth daily.   Yes Historical Provider, MD  cholecalciferol (VITAMIN D) 1000 UNITS tablet Take 1,000 Units by mouth daily.   Yes Historical Provider, MD  ezetimibe-simvastatin (VYTORIN) 10-20 MG per tablet Take 1 tablet by mouth daily.   Yes Historical Provider, MD  latanoprost (XALATAN) 0.005 % ophthalmic solution 1 drop at bedtime.   Yes Historical Provider, MD  risedronate (ACTONEL) 35 MG tablet Take 35 mg by mouth every 7 (seven) days. with water on empty stomach, nothing by mouth or lie down for next 30 minutes.   Yes Historical Provider, MD  naproxen (NAPROSYN) 500 MG tablet Take 1 tablet (500 mg total) by mouth 2 (two) times daily with a meal. 07/30/15   Jarold Song  , NP   BP 140/77 mmHg  Pulse 80  Temp(Src) 97.6 F (36.4 C) (Tympanic)  Resp 16  Ht 5\' 3"  (1.6 m)  Wt 130 lb (58.968 kg)  BMI 23.03 kg/m2  SpO2 97% Physical Exam  Constitutional: She is oriented to person, place, and time. Vital signs are normal. She  appears well-developed and well-nourished. No distress.  HENT:  Head: Normocephalic and atraumatic.  Right Ear: External ear normal.  Left Ear: External ear normal.  Nose: Nose normal.  Mouth/Throat: Oropharynx is clear and moist. No oropharyngeal exudate.  Eyes: Conjunctivae, EOM and lids are normal. Pupils are equal, round, and reactive to light. Right eye exhibits no discharge. Left eye exhibits no discharge. No scleral icterus.  Neck: Trachea normal and normal range of motion. Neck supple. No tracheal deviation present.  Cardiovascular: Normal rate, regular rhythm and intact distal pulses.   Pulmonary/Chest: Effort normal and breath sounds normal. No stridor. No respiratory distress. She has no wheezes.  Abdominal: Soft.  Musculoskeletal:       Right shoulder: She exhibits decreased range of motion. She exhibits no tenderness, no bony tenderness, no swelling, no effusion, no crepitus, no deformity, no laceration, no pain, no spasm, normal pulse and normal strength.       Left shoulder: She exhibits decreased range of motion, tenderness and pain. She exhibits no bony tenderness, no swelling, no effusion, no crepitus, no deformity, no laceration, no spasm, normal pulse and normal strength.       Right elbow: Normal.      Left elbow: Normal.       Right wrist: Normal.       Left wrist: Normal.       Right hip: Normal.       Left hip: Normal.       Right knee: Normal.       Left knee: Normal.       Right ankle: Normal. Achilles tendon normal.       Left ankle: Normal. Achilles tendon normal.       Cervical back: Normal.       Thoracic back: Normal.       Lumbar back: Normal.       Right upper arm: Normal.       Left upper arm: She exhibits tenderness. She exhibits no bony tenderness, no swelling, no edema, no deformity and no laceration.       Right forearm: Normal.       Left forearm: Normal.       Arms:      Right hand: Normal.       Left hand: Normal.       Right upper leg:  Normal.       Left upper leg: Normal.       Right lower leg: Normal.       Left lower leg: Normal.       Right foot: Normal.       Left foot: There is decreased range of motion, tenderness and swelling. There is no bony tenderness, normal capillary refill, no crepitus, no deformity and no laceration.       Feet:  Left cervical rotation causes scapular ridge discomfort; full arom c-spine/t-spine and left shoulder with discomfort + empty can, cross body pain, external rotation pain, atchley scratch; 1st toe left worst pain with extension pain with flexion also; ecchymosis dorsum MTP to DIP joint TTP over ecchymosis and tendon; not TTP plantar surface nor ecchymotic; not limping in hall  with ambulation to radiology; right shoulder decreased range of motion overhead since rotator cuff repair  Neurological: She is alert and oriented to person, place, and time. She has normal reflexes. She displays no atrophy and no tremor. No cranial nerve deficit or sensory deficit. She exhibits normal muscle tone. She displays no seizure activity. Coordination and gait normal.  Skin: Skin is warm, dry and intact. Bruising and ecchymosis noted. No abrasion, no burn, no laceration, no lesion, no petechiae, no purpura and no rash noted. Rash is not macular, not papular, not maculopapular, not nodular, not pustular, not vesicular and not urticarial. She is not diaphoretic. No cyanosis or erythema. No pallor. Nails show no clubbing.     Dorsum left great toe only; ttp dorsum only; thick callous plantar feet  Psychiatric: She has a normal mood and affect. Her speech is normal and behavior is normal. Judgment and thought content normal. Cognition and memory are normal.  Nursing note and vitals reviewed.   ED Course  Procedures (including critical care time) Labs Review Labs Reviewed - No data to display  Imaging Review Dg Toe Great Left  07/30/2015   CLINICAL DATA:  Stubbed left great toe yesterday  EXAM: LEFT GREAT  TOE  COMPARISON:  None.  FINDINGS: Three views of left great toe submitted. No acute fracture or subluxation. There is diffuse osteopenia.  IMPRESSION: No acute fracture or subluxation.  Diffuse osteopenia.   Electronically Signed   By: Natasha Mead M.D.   On: 07/30/2015 10:23   Discussed xray results with patient negative for fracture, osteopenia noted.  Patient with history of osteoporosis.  Given copy of radiology report.  Patient verbalized understanding of information and had no further questions at this time.  MDM   1. Biceps tendonitis on left   2. Biceps strain, left, initial encounter   3. Shoulder pain, acute, left   4. Turf toe, initial encounter    Naproxen 500mg  po BID x2 weeks Rx given to patient.  Discussed interaction with actonel can cause GI symptoms if worsening stop use switch to tylenol 1000mg  po QID.  Try to decrease repetitive gardening motions. Home stretches demonstrated to patient-e.g. Arm circles, walking up wall, chest stretches, neck AROM, chin tucks.  Consider physical therapy referral if no improvement with prescribed therapy--follow up with primary care.  Avoid lifting greater than 10 lbs with left arm.  Patient was instructed to rest, ice, and ROM exercises.  Activity as tolerated.    Follow up if symptoms persist or worsen.  Exitcare handout on biceps tendonitis, shoulder pain, bursitis with rehab exercises given to patient.  Patient verbalized agreement and understanding of treatment plan.  P2:  Injury Prevention and Fitness.  Patient was instructed to rest, ice and elevate the left foot, wear hard soled shoe x 2 weeks.  Activity as tolerated and work on ROM exercises.  Patient is to take NSAIDS as needed (naproxen 500mg  po BID x 2 weeks).  Discussed at risk to reinjure left great toe over the next year and to wear supportive footwear do not go barefoot.  Follow up with PCM if symptoms persist or worsen over next 7-10 days consider reimaging.  Patient verbalized  agreement and understanding of treatment plan.   P2:  Injury Prevention and Fitness.    Barbaraann Barthel, NP 07/30/15 1848  Received request for naproxen refill from [facility] today.  Contacted patient via telephone and she reported that  She had seen PCM for neck pain recently and diagnosed  with DJD.  Her toe and arm pain resolving on naproxen.  She will contact PCM office for refill on naproxen 1 week supply remaining.  Discussed with patient I would renew her naproxen another two weeks for total of one month supply if not able to have PCM fill this week.  She told me not to fill medication at this time and if difficulty with [facility] office would contact me Friday 2 Sep.  She verbalized understanding of information and had no further questions at this time.  Barbaraann Barthel, NP 08/27/15 1651

## 2015-07-30 NOTE — ED Notes (Signed)
Patient states that she tripped in the yard afternoon around 4-5pm. She states that her toe twisted, Left big toe is swollen and bruised. She states that pain was worse last night. Patient also has left arm pain. Patient states that it hurts to pick up things with her arm. She states that the arm pain started 3-4 days ago, She states that she was pulling up [place] when her arm started hurting.

## 2015-11-27 ENCOUNTER — Other Ambulatory Visit: Payer: Self-pay | Admitting: Family Medicine

## 2015-11-27 DIAGNOSIS — M25552 Pain in left hip: Secondary | ICD-10-CM

## 2015-11-27 DIAGNOSIS — M899 Disorder of bone, unspecified: Secondary | ICD-10-CM

## 2015-12-11 ENCOUNTER — Ambulatory Visit: Admission: RE | Admit: 2015-12-11 | Payer: Medicare Other | Source: Ambulatory Visit

## 2016-03-14 ENCOUNTER — Other Ambulatory Visit: Payer: Self-pay | Admitting: Family Medicine

## 2016-03-14 DIAGNOSIS — R221 Localized swelling, mass and lump, neck: Secondary | ICD-10-CM

## 2016-03-18 ENCOUNTER — Other Ambulatory Visit: Payer: Self-pay | Admitting: Family Medicine

## 2016-03-18 DIAGNOSIS — E049 Nontoxic goiter, unspecified: Secondary | ICD-10-CM

## 2016-03-18 DIAGNOSIS — R221 Localized swelling, mass and lump, neck: Secondary | ICD-10-CM

## 2016-03-19 ENCOUNTER — Ambulatory Visit
Admission: RE | Admit: 2016-03-19 | Discharge: 2016-03-19 | Disposition: A | Discharge status: Home / Self Care | Payer: Medicare Other | Source: Ambulatory Visit | Attending: Family Medicine | Admitting: Family Medicine

## 2016-03-19 DIAGNOSIS — E049 Nontoxic goiter, unspecified: Secondary | ICD-10-CM

## 2016-03-19 DIAGNOSIS — R221 Localized swelling, mass and lump, neck: Secondary | ICD-10-CM | POA: Insufficient documentation

## 2016-03-20 ENCOUNTER — Other Ambulatory Visit: Payer: Self-pay | Admitting: Family Medicine

## 2016-03-20 DIAGNOSIS — R221 Localized swelling, mass and lump, neck: Secondary | ICD-10-CM

## 2016-03-24 ENCOUNTER — Ambulatory Visit
Admission: RE | Admit: 2016-03-24 | Discharge: 2016-03-24 | Disposition: A | Discharge status: Home / Self Care | Payer: Medicare Other | Source: Ambulatory Visit | Attending: Family Medicine | Admitting: Family Medicine

## 2016-03-24 DIAGNOSIS — R221 Localized swelling, mass and lump, neck: Secondary | ICD-10-CM

## 2016-05-03 ENCOUNTER — Ambulatory Visit: Payer: Medicare Other

## 2016-05-03 ENCOUNTER — Ambulatory Visit
Admission: EM | Admit: 2016-05-03 | Discharge: 2016-05-03 | Disposition: A | Discharge status: Home / Self Care | Payer: Medicare Other | Attending: Emergency Medicine | Admitting: Emergency Medicine

## 2016-05-03 ENCOUNTER — Encounter: Payer: Self-pay | Admitting: *Deleted

## 2016-05-03 DIAGNOSIS — S93401A Sprain of unspecified ligament of right ankle, initial encounter: Secondary | ICD-10-CM | POA: Diagnosis not present

## 2016-05-03 DIAGNOSIS — X501XXA Overexertion from prolonged static or awkward postures, initial encounter: Secondary | ICD-10-CM | POA: Insufficient documentation

## 2016-05-03 DIAGNOSIS — Z79899 Other long term (current) drug therapy: Secondary | ICD-10-CM | POA: Diagnosis not present

## 2016-05-03 DIAGNOSIS — E785 Hyperlipidemia, unspecified: Secondary | ICD-10-CM | POA: Diagnosis not present

## 2016-05-03 DIAGNOSIS — Z7982 Long term (current) use of aspirin: Secondary | ICD-10-CM | POA: Diagnosis not present

## 2016-05-03 DIAGNOSIS — S99921A Unspecified injury of right foot, initial encounter: Secondary | ICD-10-CM | POA: Diagnosis present

## 2016-05-03 DIAGNOSIS — H409 Unspecified glaucoma: Secondary | ICD-10-CM | POA: Diagnosis not present

## 2016-05-03 DIAGNOSIS — M810 Age-related osteoporosis without current pathological fracture: Secondary | ICD-10-CM | POA: Insufficient documentation

## 2016-05-03 NOTE — ED Provider Notes (Signed)
CSN: [id number]     Arrival date & time 05/03/16  0821 History   First MD Initiated Contact with Patient 05/03/16 [phone]     Chief Complaint  Patient presents with  . Foot Injury   (Consider location/radiation/quality/duration/timing/severity/associated sxs/prior Treatment) HPI  This is a 70 year old female who was working in her flower bed yesterday she stepped out of the bed and twisted her right foot on a border brick. She twisted into inversion. That time she has had pain swelling and bruising over the base of the fifth metatarsal. Her ankle is unaffected. She has had difficulty with her ambulation due to the pain.     Past Medical History  Diagnosis Date  . Hyperlipidemia   . Glaucoma   . Osteoporosis    Past Surgical History  Procedure Laterality Date  . Umbilical hernia repair    . Tubal ligation    . Cholecystectomy    . Rotator cuff repair Right    History reviewed. No pertinent family history. Social History  Substance Use Topics  . Smoking status: Never Smoker   . Smokeless tobacco: Never Used  . Alcohol Use: 0.0 oz/week    0 Standard drinks or equivalent per week     Comment: occasionally   OB History    No data available     Review of Systems  Constitutional: Positive for activity change. Negative for fever, chills and fatigue.  Musculoskeletal: Positive for joint swelling and gait problem.  Skin: Positive for color change.  All other systems reviewed and are negative.   Allergies  Review of patient's allergies indicates no known allergies.  Home Medications   Prior to Admission medications   Medication Sig Start Date End Date Taking? Authorizing Provider  aspirin 81 MG tablet Take 81 mg by mouth daily.   Yes Historical Provider, MD  cholecalciferol (VITAMIN D) 1000 UNITS tablet Take 1,000 Units by mouth daily.   Yes Historical Provider, MD  ezetimibe-simvastatin (VYTORIN) 10-20 MG per tablet Take 1 tablet by mouth daily.   Yes Historical Provider, MD   latanoprost (XALATAN) 0.005 % ophthalmic solution 1 drop at bedtime.   Yes Historical Provider, MD  MELOXICAM PO Take 1 tablet by mouth daily.   Yes Historical Provider, MD  naproxen (NAPROSYN) 500 MG tablet Take 1 tablet (500 mg total) by mouth 2 (two) times daily with a meal. 07/30/15  Yes Barbaraann Barthel, NP  risedronate (ACTONEL) 35 MG tablet Take 35 mg by mouth every 7 (seven) days. with water on empty stomach, nothing by mouth or lie down for next 30 minutes.   Yes Historical Provider, MD   Meds Ordered and Administered this Visit  Medications - No data to display  BP 129/72 mmHg  Pulse 81  Temp(Src) 98.3 F (36.8 C)  Resp 18  Ht  (1.575 m)  Wt 132 lb (59.875 kg)  BMI 24.14 kg/m2  SpO2 99% No data found.   Physical Exam  Constitutional: She is oriented to person, place, and time. She appears well-developed and well-nourished. No distress.  HENT:  Head: Normocephalic and atraumatic.  Eyes: Conjunctivae are normal. Pupils are equal, round, and reactive to light.  Neck: Normal range of motion. Neck supple.  Musculoskeletal: She exhibits edema and tenderness.  Examination of the right foot shows swelling ecchymosis over the lateral foot at the base of the fifth metatarsal. Ankle range of motion shows some tenderness in the area with dorsiflexion. Subtalar motion is intact. Other ankle motion is intact.  Maximal tenderness is over the base of the fifth metatarsal but mostly over the cuboid. There is no crepitus or induration present. There is no break in the skin.  Neurological: She is alert and oriented to person, place, and time.  Skin: Skin is warm and dry. She is not diaphoretic.  Psychiatric: She has a normal mood and affect. Her behavior is normal. Judgment and thought content normal.  Nursing note and vitals reviewed.   ED Course  Procedures (including critical care time)  Labs Review Labs Reviewed - No data to display  Imaging Review Dg Foot Complete  Right  05/03/2016  CLINICAL DATA:  Pain following injury after stepping off yard paver 1 day prior EXAM: RIGHT FOOT COMPLETE - 3+ VIEW COMPARISON:  None. FINDINGS: Frontal, oblique, and lateral views were obtained. There is no demonstrable fracture or dislocation. There is mild joint space narrowing in the first MTP joint as well as in all DIP joints. No erosive change. IMPRESSION: Areas of mild osteoarthritic change.  No fracture or dislocation. Electronically Signed   By: Bretta BangWilliam  Woodruff III M.D.   On: 05/03/2016 09:25     Visual Acuity Review  Right Eye Distance:   Left Eye Distance:   Bilateral Distance:    Right Eye Near:   Left Eye Near:    Bilateral Near:         MDM   1. Sprain of right ankle, initial encounter   2.  Possible cuboid subluxation or ligamentous damage.  New Prescriptions   No medications on file  Patient states that she has some Mobic at home but is unsure of the milligram strength. He would prefer to take this medication for pain. She will calls if her prescription is not renewable; she has a few left I told her that I would call in a prescription for 7.5 mg Mobic. She is not improving in 1 week I recommended she see a podiatrist for further evaluation. She may need an MRI of the ankle if she is not improving. She needs to elevate and ice the area for the most time today and tomorrow. I warned her to not be up on it and to try to keep is comfortable is possible.     Lutricia FeilWilliam P , PA-C 05/03/16 [phone]

## 2016-05-03 NOTE — Discharge Instructions (Signed)
Ankle Sprain  An ankle sprain is an injury to the strong, fibrous tissues (ligaments) that hold the bones of your ankle joint together.   CAUSES  An ankle sprain is usually caused by a fall or by twisting your ankle. Ankle sprains most commonly occur when you step on the outer edge of your foot, and your ankle turns inward. People who participate in sports are more prone to these types of injuries.   SYMPTOMS    Pain in your ankle. The pain may be present at rest or only when you are trying to stand or walk.   Swelling.   Bruising. Bruising may develop immediately or within 1 to 2 days after your injury.   Difficulty standing or walking, particularly when turning corners or changing directions.  DIAGNOSIS   Your caregiver will ask you details about your injury and perform a physical exam of your ankle to determine if you have an ankle sprain. During the physical exam, your caregiver will press on and apply pressure to specific areas of your foot and ankle. Your caregiver will try to move your ankle in certain ways. An X-ray exam may be done to be sure a bone was not broken or a ligament did not separate from one of the bones in your ankle (avulsion fracture).   TREATMENT   Certain types of braces can help stabilize your ankle. Your caregiver can make a recommendation for this. Your caregiver may recommend the use of medicine for pain. If your sprain is severe, your caregiver may refer you to a surgeon who helps to restore function to parts of your skeletal system (orthopedist) or a physical therapist.  HOME CARE INSTRUCTIONS    Apply ice to your injury for 1-2 days or as directed by your caregiver. Applying ice helps to reduce inflammation and pain.    Put ice in a plastic bag.    Place a towel between your skin and the bag.    Leave the ice on for 15-20 minutes at a time, every 2 hours while you are awake.   Only take over-the-counter or prescription medicines for pain, discomfort, or fever as directed by  your caregiver.   Elevate your injured ankle above the level of your heart as much as possible for 2-3 days.   If your caregiver recommends crutches, use them as instructed. Gradually put weight on the affected ankle. Continue to use crutches or a cane until you can walk without feeling pain in your ankle.   If you have a plaster splint, wear the splint as directed by your caregiver. Do not rest it on anything harder than a pillow for the first 24 hours. Do not put weight on it. Do not get it wet. You may take it off to take a shower or bath.   You may have been given an elastic bandage to wear around your ankle to provide support. If the elastic bandage is too tight (you have numbness or tingling in your foot or your foot becomes cold and blue), adjust the bandage to make it comfortable.   If you have an air splint, you may blow more air into it or let air out to make it more comfortable. You may take your splint off at night and before taking a shower or bath. Wiggle your toes in the splint several times per day to decrease swelling.  SEEK MEDICAL CARE IF:    You have rapidly increasing bruising or swelling.   Your toes feel   extremely cold or you lose feeling in your foot.   Your pain is not relieved with medicine.  SEEK IMMEDIATE MEDICAL CARE IF:   Your toes are numb or blue.   You have severe pain that is increasing.  MAKE SURE YOU:    Understand these instructions.   Will watch your condition.   Will get help right away if you are not doing well or get worse.     This information is not intended to replace advice given to you by your health care provider. Make sure you discuss any questions you have with your health care provider.     Document Released: 12/15/2005 Document Revised: 01/05/2015 Document Reviewed: 12/27/2011  [employer]vier Interactive Patient Education 2016 [employer]

## 2016-05-03 NOTE — ED Notes (Signed)
Patient was working in her flower bed when she step out of the bed she turned her right foot on the border brick. Patient did not fall, but had to sit down for a few minutes to get her "bearings". Patient does have a history of broken toes on her right foot.

## 2016-05-09 ENCOUNTER — Other Ambulatory Visit: Payer: Self-pay | Admitting: Family Medicine

## 2016-05-09 DIAGNOSIS — Z1231 Encounter for screening mammogram for malignant neoplasm of breast: Secondary | ICD-10-CM

## 2016-10-30 ENCOUNTER — Ambulatory Visit
Admission: EM | Admit: 2016-10-30 | Discharge: 2016-10-30 | Disposition: A | Discharge status: Home / Self Care | Payer: Medicare Other | Attending: Family Medicine | Admitting: Family Medicine

## 2016-10-30 ENCOUNTER — Ambulatory Visit: Payer: Medicare Other

## 2016-10-30 DIAGNOSIS — E785 Hyperlipidemia, unspecified: Secondary | ICD-10-CM | POA: Insufficient documentation

## 2016-10-30 DIAGNOSIS — M545 Low back pain, unspecified: Secondary | ICD-10-CM

## 2016-10-30 DIAGNOSIS — M62830 Muscle spasm of back: Secondary | ICD-10-CM | POA: Diagnosis not present

## 2016-10-30 DIAGNOSIS — Z9889 Other specified postprocedural states: Secondary | ICD-10-CM | POA: Diagnosis not present

## 2016-10-30 DIAGNOSIS — Z7982 Long term (current) use of aspirin: Secondary | ICD-10-CM | POA: Insufficient documentation

## 2016-10-30 DIAGNOSIS — Z780 Asymptomatic menopausal state: Secondary | ICD-10-CM | POA: Insufficient documentation

## 2016-10-30 DIAGNOSIS — Z9049 Acquired absence of other specified parts of digestive tract: Secondary | ICD-10-CM | POA: Insufficient documentation

## 2016-10-30 DIAGNOSIS — Z79899 Other long term (current) drug therapy: Secondary | ICD-10-CM | POA: Insufficient documentation

## 2016-10-30 DIAGNOSIS — H409 Unspecified glaucoma: Secondary | ICD-10-CM | POA: Diagnosis not present

## 2016-10-30 LAB — URINALYSIS COMPLETE WITH MICROSCOPIC (ARMC ONLY)
Bacteria, UA: NONE SEEN
Bilirubin Urine: NEGATIVE
GLUCOSE, UA: NEGATIVE mg/dL
Hgb urine dipstick: NEGATIVE
Ketones, ur: NEGATIVE mg/dL
Leukocytes, UA: NEGATIVE
Nitrite: NEGATIVE
Protein, ur: NEGATIVE mg/dL
RBC / HPF: NONE SEEN RBC/hpf (ref 0–5)
SPECIFIC GRAVITY, URINE: 1.015 (ref 1.005–1.030)
pH: 7 (ref 5.0–8.0)

## 2016-10-30 MED ORDER — TRAMADOL HCL 50 MG PO TABS: 50.0000 mg | ORAL_TABLET | Freq: Three times a day (TID) | ORAL | 0 refills | Status: DC | PRN

## 2016-10-30 MED ORDER — ORPHENADRINE CITRATE ER 100 MG PO TB12: 100.0000 mg | ORAL_TABLET | Freq: Two times a day (BID) | ORAL | 0 refills | Status: DC

## 2016-10-30 NOTE — ED Provider Notes (Signed)
[facility]    CSN: [id number] Arrival date & time: 10/30/16  1424     History   Chief Complaint Chief Complaint  Patient presents with  . Back Pain    HPI Mindy Howe is a 70 y.o. female.   Patient is a 70 year old white female with back pain that started this afternoon she denies doing anything particular denies any lifting or trauma to her back when the pain started. Pain has been sharp and was quite severe. She states that when he started it was 8-9 out of 10 she took some Naprosyn before she got here and now it is a 4-5 out of 10. The pain is still quite intense overall and she states that movement makes it worse. About a year ago which was the last stress she fell and found out she had a compression fracture 1 lumbar spine and 2 cervical vertebral spines. [facility].  She has history glaucoma hyperlipidemia and osteoporosis but recently has been added diagnosis changed osteopenia. She is on medicine for osteoporosis she's had a cholecystectomy rotator cuff repair tubal ligation and umbilical hernia repair. She never smoked and she has no known drug allergies. No pertinent family medical history pertaining to today's visit.   The history is provided by the patient. No language interpreter was used.  Back Pain  Location:  Lumbar spine Quality:  Aching and stabbing Radiates to:  Does not radiate Pain severity:  Moderate Onset quality:  Sudden Timing:  Constant Progression:  Worsening Chronicity:  New Context: not emotional stress, not falling, not jumping from heights, not lifting heavy objects, not occupational injury, not physical stress and not recent illness   Relieved by:  NSAIDs Worsened by:  Movement Associated symptoms: paresthesias   Associated symptoms: no fever   Risk factors: hx of osteoporosis and menopause   Risk factors: no recent surgery     Past Medical History:    Diagnosis Date  . Glaucoma   . Hyperlipidemia   . Osteoporosis     There are no active problems to display for this patient.   Past Surgical History:  Procedure Laterality Date  . CHOLECYSTECTOMY    . ROTATOR CUFF REPAIR Right   . TUBAL LIGATION    . UMBILICAL HERNIA REPAIR      OB History    No data available       Home Medications    Prior to Admission medications   Medication Sig Start Date End Date Taking? Authorizing Provider  aspirin 81 MG tablet Take 81 mg by mouth daily.   Yes Historical Provider, MD  cholecalciferol (VITAMIN D) 1000 UNITS tablet Take 1,000 Units by mouth daily.   Yes Historical Provider, MD  ezetimibe-simvastatin (VYTORIN) 10-20 MG per tablet Take 1 tablet by mouth daily.   Yes Historical Provider, MD  latanoprost (XALATAN) 0.005 % ophthalmic solution 1 drop at bedtime.   Yes Historical Provider, MD  naproxen (NAPROSYN) 500 MG tablet Take 1 tablet (500 mg total) by mouth 2 (two) times daily with a meal. 07/30/15  Yes Barbaraann Barthelina A Betancourt, NP  risedronate (ACTONEL) 35 MG tablet Take 35 mg by mouth every 7 (seven) days. with water on empty stomach, nothing by mouth or lie down for next 30 minutes.   Yes Historical Provider, MD  MELOXICAM PO Take 1 tablet by mouth daily.    Historical Provider, MD  orphenadrine (NORFLEX) 100 MG tablet  Take 1 tablet (100 mg total) by mouth 2 (two) times daily. 10/30/16   Hassan RowanEugene , MD  traMADol (ULTRAM) 50 MG tablet Take 1 tablet (50 mg total) by mouth every 8 (eight) hours as needed for moderate pain or severe pain. 10/30/16   Hassan RowanEugene , MD    Family History No family history on file.  Social History Social History  Substance Use Topics  . Smoking status: Never Smoker  . Smokeless tobacco: Never Used  . Alcohol use 0.0 oz/week     Comment: occasionally     Allergies   Review of patient's allergies indicates no known allergies.   Review of Systems Review of Systems  Constitutional: Negative for fever.   Musculoskeletal: Positive for back pain.  Neurological: Positive for paresthesias.  All other systems reviewed and are negative.    Physical Exam Triage Vital Signs ED Triage Vitals  Enc Vitals Group     BP 10/30/16 1524 134/73     Pulse Rate 10/30/16 1524 86     Resp 10/30/16 1524 16     Temp 10/30/16 1524 97.6 F (36.4 C)     Temp Source 10/30/16 1524 Tympanic     SpO2 10/30/16 1524 98 %     Weight 10/30/16 1522 130 lb (59 kg)     Height 10/30/16 1522 5\' 3"  (1.6 m)     Head Circumference --      Peak Flow --      Pain Score 10/30/16 1524 6     Pain Loc --      Pain Edu? --      Excl. in GC? --    No data found.   Updated Vital Signs BP 134/73 (BP Location: Left Arm)   Pulse 86   Temp 97.6 F (36.4 C) (Tympanic)   Resp 16   Ht 5\' 3"  (1.6 m)   Wt 130 lb (59 kg)   SpO2 98%   BMI 23.03 kg/m   Visual Acuity Right Eye Distance:   Left Eye Distance:   Bilateral Distance:    Right Eye Near:   Left Eye Near:    Bilateral Near:     Physical Exam  Constitutional: She is oriented to person, place, and time. She appears well-developed and well-nourished. She appears distressed.  HENT:  Head: Normocephalic and atraumatic.  Eyes: Pupils are equal, round, and reactive to light.  Neck: Normal range of motion.  Pulmonary/Chest: Effort normal.  Musculoskeletal: She exhibits tenderness.       Lumbar back: She exhibits tenderness, pain and spasm.       Back:  Patient has tenderness along the lumbar spine and bilateral muscle spasms paraspinal muscles as well.  Neurological: She is alert and oriented to person, place, and time. She has normal reflexes. No cranial nerve deficit.  Skin: Skin is warm and dry. She is not diaphoretic.  Psychiatric: She has a normal mood and affect.  Vitals reviewed.    UC Treatments / Results  Labs (all labs ordered are listed, but only abnormal results are displayed) Labs Reviewed  URINALYSIS COMPLETEWITH MICROSCOPIC ([facility] ONLY) -  Abnormal; Notable for the following:       Result Value   Squamous Epithelial / LPF 0-5 (*)    All other components within normal limits  URINE CULTURE    EKG  EKG Interpretation None       Radiology Dg Lumbar Spine Complete  Result Date: 10/30/2016 CLINICAL DATA:  Low back pain beginning today. No  previous injury or surgery. EXAM: LUMBAR SPINE - COMPLETE 4+ VIEW COMPARISON:  None. FINDINGS: There is depression of the central aspect of the upper endplate of L4. All remaining vertebrae are normal in height. There is no spondylolisthesis. There is a mild dextroscoliosis with the apex at L2-L3. There is mild right lateral subluxation of L3 in relation to L4 on the AP view. There is moderate loss of disc height at L3-L4 with mild loss of disc height at L4-L5. Small endplate osteophytes are noted from the lower thoracic spine through the upper endplate of L5. Facet joints are well preserved. Bones are demineralized. There are scattered calcifications noted along a normal caliber abdominal aorta. Patient has a previous cholecystectomy. IMPRESSION: 1. Mild depression of the central aspect of the upper endplate of L4. This may be degenerative. Mild compression fracture is possible. This could be further assessed, if desired clinically, with lumbar spine MRI. 2. No other evidence of a fracture. 3. Dextroscoliosis and disc degenerative changes as detailed. Electronically Signed   By: Amie Portland M.D.   On: 10/30/2016 16:38    Procedures Procedures (including critical care time)  Medications Ordered in UC Medications - No data to display   Initial Impression / Assessment and Plan / UC Course  I have reviewed the triage vital signs and the nursing notes.  Pertinent labs & imaging results that were available during my care of the patient were reviewed by me and considered in my medical decision making (see chart for details).  Clinical Course     Patient still with significant amount of pain  that has gone down from an 8 to a 5. Time of discharge it was accidental to 3 which is great. When over the x-ray reports x-ray was negative for new fracture is a questionable old fracture which was seen as one that she had one or 2 years ago. Tramadol does work for her so we will give a prescription for tramadol warned her that she is careful with it. Patient was checked [facility] reporting site and she has had tramadol and Percocet before but the last time according to the site was over a year ago. Follow-up for PCP tomorrow. Or next scheduled appointment.  Final Clinical Impressions(s) / UC Diagnoses   Final diagnoses:  Acute bilateral low back pain without sciatica  Muscle spasm of back    New Prescriptions New Prescriptions   ORPHENADRINE (NORFLEX) 100 MG TABLET    Take 1 tablet (100 mg total) by mouth 2 (two) times daily.   TRAMADOL (ULTRAM) 50 MG TABLET    Take 1 tablet (50 mg total) by mouth every 8 (eight) hours as needed for moderate pain or severe pain.      Note: This dictation was prepared with Dragon dictation along with smaller phrase technology. Any transcriptional errors that result from this process are unintentional.   Hassan Rowan, MD 10/30/16 1719

## 2016-10-30 NOTE — ED Triage Notes (Signed)
Patient complains of mid-low back pain that started suddenly around 1230pm today. Patient states that she has had no known injury. Patient reports that pain is equal bilateral. Patient also reports that she has lower abdominal pain as well. Patient denies any burning or pain with urination, patient denies urinary frequency.

## 2016-11-01 LAB — URINE CULTURE: SPECIAL REQUESTS: NORMAL

## 2016-11-02 ENCOUNTER — Telehealth: Payer: Self-pay | Admitting: *Deleted

## 2016-11-02 NOTE — Telephone Encounter (Signed)
Called patient, verified DOB, communicated inconclusive urine culture result. Patient reported feeling much better. Advised patient to follow up with PCP if symptoms return.

## 2018-04-05 ENCOUNTER — Ambulatory Visit
Admission: EM | Admit: 2018-04-05 | Discharge: 2018-04-05 | Disposition: A | Discharge status: Home / Self Care | Payer: Medicare Other | Attending: Family Medicine | Admitting: Family Medicine

## 2018-04-05 ENCOUNTER — Encounter: Payer: Self-pay | Admitting: *Deleted

## 2018-04-05 DIAGNOSIS — J0100 Acute maxillary sinusitis, unspecified: Secondary | ICD-10-CM

## 2018-04-05 MED ORDER — AMOXICILLIN-POT CLAVULANATE 875-125 MG PO TABS: 1.0000 | ORAL_TABLET | Freq: Two times a day (BID) | ORAL | 0 refills | Status: DC

## 2018-04-05 NOTE — ED Provider Notes (Signed)
[facility] ____________________________________________  Time seen: Approximately 9:02 AM  I have reviewed the triage vital signs and the nursing notes.   HISTORY  Chief Complaint Facial Pain   HPI Mindy Howe is a 72 y.o. female presenting for evaluation of 1 week of nasal congestion, nasal drainage, occasional cough and some intermittent throat scratchiness.  Denies accompanying body aches, chills or known fevers.  Has been taking intermittent over-the-counter DayQuil.  States her daughter recently had the flu, but does not believe she had influenza during the sickness.  States that she felt initially it was a virus or allergies.  States does have a history of seasonal allergies around this time.  Has continued to eat and drink well.  States having a lot of sinus pressure and sinus congestion, stating mild to moderate currently.  Denies other aggravating or alleviating factors.  Reports otherwise feels well and is continue to remain active. Denies chest pain, shortness of breath, abdominal pain, dysuria, or rash. Denies recent sickness. Denies recent antibiotic use.   Rayetta Humphrey, MD: PCP   Past Medical History:  Diagnosis Date  . Glaucoma   . Hyperlipidemia   . Osteoporosis     There are no active problems to display for this patient.   Past Surgical History:  Procedure Laterality Date  . CHOLECYSTECTOMY    . ROTATOR CUFF REPAIR Right   . TUBAL LIGATION    . UMBILICAL HERNIA REPAIR       No current facility-administered medications for this encounter.   Current Outpatient Medications:  .  aspirin 81 MG tablet, Take 81 mg by mouth daily., Disp: , Rfl:  .  cholecalciferol (VITAMIN D) 1000 UNITS tablet, Take 1,000 Units by mouth daily., Disp: , Rfl:  .  ezetimibe-simvastatin (VYTORIN) 10-20 MG per tablet, Take 1 tablet by mouth daily., Disp: , Rfl:  .  latanoprost (XALATAN) 0.005 % ophthalmic solution, 1 drop at bedtime., Disp: , Rfl:  .   risedronate (ACTONEL) 35 MG tablet, Take 35 mg by mouth every 7 (seven) days. with water on empty stomach, nothing by mouth or lie down for next 30 minutes., Disp: , Rfl:  .  amoxicillin-clavulanate (AUGMENTIN) 875-125 MG tablet, Take 1 tablet by mouth every 12 (twelve) hours., Disp: 20 tablet, Rfl: 0 .  naproxen (NAPROSYN) 500 MG tablet, Take 1 tablet (500 mg total) by mouth 2 (two) times daily with a meal., Disp: 60 tablet, Rfl: 0  Allergies Patient has no known allergies.  History reviewed. No pertinent family history.  Social History Social History   Tobacco Use  . Smoking status: Never Smoker  . Smokeless tobacco: Never Used  Substance Use Topics  . Alcohol use: Yes    Alcohol/week: 0.0 oz    Comment: occasionally  . Drug use: No    Review of Systems Constitutional: No fever/chills ENT: As above Cardiovascular: Denies chest pain. Respiratory: Denies shortness of breath. Gastrointestinal: No abdominal pain.   Musculoskeletal: Negative for back pain. Skin: Negative for rash.  ____________________________________________   PHYSICAL EXAM:  VITAL SIGNS: ED Triage Vitals  Enc Vitals Group     BP 04/05/18 0832 127/77     Pulse Rate 04/05/18 0832 85     Resp 04/05/18 0832 16     Temp 04/05/18 0832 98 F (36.7 C)     Temp Source 04/05/18 0832 Oral     SpO2 04/05/18 0832 99 %     Weight 04/05/18 0829 128 lb (58.1 kg)  Height 04/05/18 0829 5\' 2"  (1.575 m)     Head Circumference --      Peak Flow --      Pain Score 04/05/18 0829 0     Pain Loc --      Pain Edu? --      Excl. in GC? --     Constitutional: Alert and oriented. Well appearing and in no acute distress. Eyes: Conjunctivae are normal. PERRL. EOMI. Head: Atraumatic.Mild tenderness to palpation bilateral frontal and in mild to moderate tenderness maxillary sinuses. No swelling. No erythema.   Ears: no erythema, normal TMs bilaterally.   Nose: nasal congestion with bilateral nasal turbinate erythema and  edema.   Mouth/Throat: Mucous membranes are moist.  Oropharynx non-erythematous.No tonsillar swelling or exudate.  Neck: No stridor.  No cervical spine tenderness to palpation. Hematological/Lymphatic/Immunilogical: No cervical lymphadenopathy. Cardiovascular: Normal rate, regular rhythm. Grossly normal heart sounds.  Good peripheral circulation. Respiratory: Normal respiratory effort.  No retractions.  No wheezes, rales or rhonchi. Good air movement.  Musculoskeletal: Steady gait.  No cervical, thoracic or lumbar tenderness to palpation.  Neurologic:  Normal speech and language. No gait instability. Skin:  Skin is warm, dry and intact. No rash noted. Psychiatric: Mood and affect are normal. Speech and behavior are normal.  ___________________________________________   LABS (all labs ordered are listed, but only abnormal results are displayed)  Labs Reviewed - No data to display ____________________________________________   PROCEDURES Procedures    INITIAL IMPRESSION / ASSESSMENT AND PLAN / ED COURSE  Pertinent labs & imaging results that were available during my care of the patient were reviewed by me and considered in my medical decision making (see chart for details).  Well-appearing patient.  No acute distress.  Suspect recent viral illness versus allergic rhinitis with secondary sinusitis.  Will treat patient with oral Augmentin, continue over-the-counter decongestant or antihistamine.  Encouraged rest, fluids, supportive care.Discussed indication, risks and benefits of medications with patient.  Discussed follow up with Primary care physician this week. Discussed follow up and return parameters including no resolution or any worsening concerns. Patient verbalized understanding and agreed to plan.   ____________________________________________   FINAL CLINICAL IMPRESSION(S) / ED DIAGNOSES  Final diagnoses:  Acute maxillary sinusitis, recurrence not specified     ED  Discharge Orders        Ordered    amoxicillin-clavulanate (AUGMENTIN) 875-125 MG tablet  Every 12 hours     04/05/18 0907       Note: This dictation was prepared with Dragon dictation along with smaller phrase technology. Any transcriptional errors that result from this process are unintentional.         Renford DillsMiller, , NP 04/05/18 1034

## 2018-04-05 NOTE — Discharge Instructions (Addendum)
Take medication as prescribed. Rest. Drink plenty of fluids.  ° °Follow up with your primary care physician this week as needed. Return to Urgent care for new or worsening concerns.  ° °

## 2018-04-05 NOTE — ED Triage Notes (Signed)
C/O sinus pressure, headache, productive cough (clear) and scratchy throat since last Wednesday.

## 2019-06-02 ENCOUNTER — Other Ambulatory Visit: Payer: Self-pay

## 2019-06-02 ENCOUNTER — Ambulatory Visit
Admission: EM | Admit: 2019-06-02 | Discharge: 2019-06-02 | Disposition: A | Discharge status: Other Acute Inpatient Hospital | Payer: Medicare Other | Attending: Emergency Medicine | Admitting: Emergency Medicine

## 2019-06-02 ENCOUNTER — Encounter: Payer: Self-pay | Admitting: Emergency Medicine

## 2019-06-02 DIAGNOSIS — M542 Cervicalgia: Secondary | ICD-10-CM

## 2019-06-02 DIAGNOSIS — R0789 Other chest pain: Secondary | ICD-10-CM | POA: Insufficient documentation

## 2019-06-02 DIAGNOSIS — R42 Dizziness and giddiness: Secondary | ICD-10-CM | POA: Insufficient documentation

## 2019-06-02 DIAGNOSIS — H6123 Impacted cerumen, bilateral: Secondary | ICD-10-CM | POA: Diagnosis present

## 2019-06-02 NOTE — Discharge Instructions (Signed)
Go directly to Emergency room as discussed.  °

## 2019-06-02 NOTE — ED Provider Notes (Signed)
[facility] ____________________________________________  Time seen: Approximately 10:48 AM  I have reviewed the triage vital signs and the nursing notes.   HISTORY  Chief Complaint Ear Fullness and Dizziness   HPI Mindy Howe is a 73 y.o. female past medical history of hyperlipidemia presenting for evaluation of left ear fullness and clogged sensation with accompanying dizziness.  Patient reports both ears feel somewhat muffled and clogged, left greater than right.  States she has a history of this recurring with wax buildup.  Has been using over-the-counter peroxide with some wax removal.  States today she is to have any accompanying dizziness, which she does report this is happened similarly before.  States the dizziness is present with head position turns, but not present otherwise.  No accompanying cough, congestion or fevers.  Patient however also reports since last night she has been having intermittent mid chest pressure sensation.  States this is not a pain but a pressure.  No radiation and no accompanying shortness of breath.  Patient reports she has had this occasionally for several years, however reports that this time it is atypical as it keeps coming back.  States the chest pressure since last night comes and goes frequently.  Reports in the past she would have it once and not again for extended periods of time.  States last night she attributed to acid reflux though not related to eating, however today with dizziness she felt she should mention this.  Patient reports she has a significant family history of cardiac disease.  Personal history of hyperlipidemia.  No history of smoking.  No diabetes.  States pressure is mild to moderate currently.  Denies pressure radiation, but reports she is also having left-sided neck discomfort which is atypical for her and describes it as a muscle soreness.  Rayetta Humphrey, MD: PCP  Past Medical History:  Diagnosis Date   . Glaucoma   . Hyperlipidemia   . Osteoporosis     There are no active problems to display for this patient.   Past Surgical History:  Procedure Laterality Date  . CHOLECYSTECTOMY    . ROTATOR CUFF REPAIR Right   . TUBAL LIGATION    . UMBILICAL HERNIA REPAIR       No current facility-administered medications for this encounter.   Current Outpatient Medications:  .  aspirin 81 MG tablet, Take 81 mg by mouth daily., Disp: , Rfl:  .  cholecalciferol (VITAMIN D) 1000 UNITS tablet, Take 1,000 Units by mouth daily., Disp: , Rfl:  .  ezetimibe-simvastatin (VYTORIN) 10-20 MG per tablet, Take 1 tablet by mouth daily., Disp: , Rfl:  .  latanoprost (XALATAN) 0.005 % ophthalmic solution, 1 drop at bedtime., Disp: , Rfl:  .  naproxen (NAPROSYN) 500 MG tablet, Take 1 tablet (500 mg total) by mouth 2 (two) times daily with a meal., Disp: 60 tablet, Rfl: 0 .  risedronate (ACTONEL) 35 MG tablet, Take 35 mg by mouth every 7 (seven) days. with water on empty stomach, nothing by mouth or lie down for next 30 minutes., Disp: , Rfl:   Allergies Patient has no known allergies.  family history Brother: CAD Father: CAD Mother: CVA  Social History Social History   Tobacco Use  . Smoking status: Never Smoker  . Smokeless tobacco: Never Used  Substance Use Topics  . Alcohol use: Yes    Alcohol/week: 0.0 standard drinks    Comment: occasionally  . Drug use: No    Review of Systems Constitutional: No  fever Eyes: No visual changes. ENT: No sore throat.  Positive ear discomfort. Cardiovascular: Denies chest pain.  Positive chest pressure. Respiratory: Denies shortness of breath. Gastrointestinal: No abdominal pain.  No nausea, no vomiting.  No diarrhea.  No constipation. Genitourinary: Negative for dysuria. Musculoskeletal: Negative for back pain.  Positive left-sided neck discomfort.   Skin: Negative for rash. Neurological: Negative for headaches, focal weakness or numbness.  Positive  dizziness.    ____________________________________________   PHYSICAL EXAM:  VITAL SIGNS: ED Triage Vitals  Enc Vitals Group     BP 06/02/19 1024 (!) 147/88     Pulse Rate 06/02/19 1024 80     Resp 06/02/19 1024 18     Temp 06/02/19 1024 98.1 F (36.7 C)     Temp Source 06/02/19 1024 Oral     SpO2 06/02/19 1024 99 %     Weight 06/02/19 1025 130 lb (59 kg)     Height 06/02/19 1025 5\' 1"  (1.549 m)     Head Circumference --      Peak Flow --      Pain Score 06/02/19 1024 0     Pain Loc --      Pain Edu? --      Excl. in GC? --     Constitutional: Alert and oriented. Well appearing and in no acute distress. Eyes: Conjunctivae are normal. PERRL. EOMI. ENT      Head: Normocephalic and atraumatic.      Ears:   Left: Nontender, minimal tenderness inferior external ear, total cerumen impaction, post removal canal clear, no erythema, normal TM.  Right: Nontender, total cerumen impaction.post cerumen removal canal clear, no erythema, normal TM.      Nose: No congestion Cardiovascular: Normal rate, regular rhythm. Grossly normal heart sounds.  Good peripheral circulation. Respiratory: Normal respiratory effort without tachypnea nor retractions. Breath sounds are clear and equal bilaterally. No wheezes, rales, rhonchi. Gastrointestinal: Soft and nontender.  Musculoskeletal: Steady gait. Neurologic:  Normal speech and language. No gross focal neurologic deficits are appreciated. Speech is normal.  No paresthesias. Skin:  Skin is warm, dry and intact. No rash noted. Psychiatric: Mood and affect are normal. Speech and behavior are normal. Patient exhibits appropriate insight and judgment   ___________________________________________   LABS (all labs ordered are listed, but only abnormal results are displayed)  Labs Reviewed - No data to display ____________________________________________  EKG  ED ECG REPORT I, Renford DillsLindsey , the attending provider, personally viewed and  interpreted this ECG.   Date: 06/02/2019  EKG Time: 1052  Rate: 81  Rhythm:  normal sinus rhythm  Axis: normal  Intervals:none  ST&T Change: none No visible comparison.   RADIOLOGY  No results found. ____________________________________________   PROCEDURES Procedures   Cerumen total impaction noted bilaterally.  Wax is removed by irrigation by RN. Instructions for home care to prevent wax buildup are given.   INITIAL IMPRESSION / ASSESSMENT AND PLAN / ED COURSE  Pertinent labs & imaging results that were available during my care of the patient were reviewed by me and considered in my medical decision making (see chart for details).  Patient presenting for evaluation of multiple complaints.  Patient with total cerumen impaction suspect positional related head movements related to the dizziness and cerumen impaction.  Bilateral cerumen impaction removed in urgent care, patient reports improved hearing as well as dizzy sensation immediately after.  However patient with chest pressure and left-sided neck discomfort concern for cardiac etiology, multiple differentials discussed with patient but  recommend further evaluation in the emergency room at this time.  Patient states that her friend will drive her directly to [facility] directly from here for further evaluation.  Patient verbalized understanding or risks and benefits of private transfer.   Discussed follow up with Primary care physician this week. Discussed follow up and return parameters including no resolution or any worsening concerns. Patient verbalized understanding and agreed to plan.   ____________________________________________   FINAL CLINICAL IMPRESSION(S) / ED DIAGNOSES  Final diagnoses:  Dizziness  Bilateral impacted cerumen  Chest pressure  Neck discomfort     ED Discharge Orders    None       Note: This dictation was prepared with Dragon dictation along with smaller phrase technology. Any  transcriptional errors that result from this process are unintentional.         Renford Dills, NP 06/02/19 1151

## 2019-06-02 NOTE — ED Triage Notes (Addendum)
Patient states she has an ongoing problem with ear wax in her left ear. She states she used some peroxide last night in her ear. She states this morning when got up she was very dizzy. She states she has had this before and it was related to her ear.  Patient also reports what she thought was reflux but she describes this as chest pressure that started yesterday evening.

## 2019-09-26 ENCOUNTER — Other Ambulatory Visit: Payer: Self-pay

## 2019-09-26 DIAGNOSIS — Z20822 Contact with and (suspected) exposure to covid-19: Secondary | ICD-10-CM

## 2019-09-27 LAB — SPECIMEN STATUS REPORT

## 2019-09-27 LAB — NOVEL CORONAVIRUS, NAA: SARS-CoV-2, NAA: NOT DETECTED

## 2020-08-22 ENCOUNTER — Ambulatory Visit
Admission: EM | Admit: 2020-08-22 | Discharge: 2020-08-22 | Disposition: A | Discharge status: Home / Self Care | Payer: Medicare Other | Attending: Family Medicine | Admitting: Family Medicine

## 2020-08-22 DIAGNOSIS — Z20822 Contact with and (suspected) exposure to covid-19: Secondary | ICD-10-CM | POA: Diagnosis not present

## 2020-08-22 DIAGNOSIS — Z1152 Encounter for screening for COVID-19: Secondary | ICD-10-CM | POA: Diagnosis not present

## 2020-08-22 DIAGNOSIS — Z01812 Encounter for preprocedural laboratory examination: Secondary | ICD-10-CM | POA: Diagnosis present

## 2020-08-22 DIAGNOSIS — Z0189 Encounter for other specified special examinations: Secondary | ICD-10-CM

## 2020-08-22 NOTE — ED Triage Notes (Signed)
Patient in today requesting a covid test for travel. Patient denies any symptoms. Patient has been vaccinated.

## 2020-08-23 LAB — SARS CORONAVIRUS 2 (TAT 6-24 HRS): SARS Coronavirus 2: NEGATIVE

## 2020-09-20 ENCOUNTER — Ambulatory Visit
Admission: EM | Admit: 2020-09-20 | Discharge: 2020-09-20 | Disposition: A | Discharge status: Home / Self Care | Payer: Medicare Other | Attending: Internal Medicine | Admitting: Internal Medicine

## 2020-09-20 ENCOUNTER — Encounter: Payer: Self-pay | Admitting: Emergency Medicine

## 2020-09-20 ENCOUNTER — Other Ambulatory Visit: Payer: Self-pay

## 2020-09-20 ENCOUNTER — Ambulatory Visit (INDEPENDENT_AMBULATORY_CARE_PROVIDER_SITE_OTHER): Payer: Medicare Other

## 2020-09-20 DIAGNOSIS — M546 Pain in thoracic spine: Secondary | ICD-10-CM | POA: Diagnosis not present

## 2020-09-20 MED ORDER — NAPROXEN 500 MG PO TABS: 500.0000 mg | ORAL_TABLET | Freq: Two times a day (BID) | ORAL | 0 refills | Status: DC

## 2020-09-20 NOTE — ED Triage Notes (Addendum)
Patient in today c/o pain between her shoulder blades since last night. No injury noted. Patient denies sob, nausea, jaw pain or arm pain. Patient tried a Lidocaine patch today without relief. Patient states certain movements make the pain worse.

## 2020-09-20 NOTE — Discharge Instructions (Signed)
Your xray on the area of pain shows spondylosis or arthritis. Spondylosis is age-related change of the bones (vertebrae) and discs of the spine. These changes are often called degenerative disc disease and osteoarthritis. These changes don't always cause symptoms. But they are a common cause of spine problems that can range from mild to severe.  You may take your Naprosyn twice a day for 7 days to help with inflammation. If symptoms persist, please follow up with your family doctor and may need more tests done, which we cant order here.

## 2020-09-20 NOTE — ED Provider Notes (Signed)
[facility]    CSN: [id number] Arrival date & time: 09/20/20  1436      History   Chief Complaint Chief Complaint  Patient presents with  . pain between shoulder blades    HPI Mindy Howe is a 74 y.o. female who presents with mid thoracic pain which started yesterday evening. She had been home just doing house chores and paper work. Denies lifting anything heavy, sneezing or coughing. She denies SOB,CP, nausea, jaw pain, sweats. Has hx of osteoporosis and is on meds for this and her last bone scan showed osteopenia 2 years ago. Had trouble sleeping last night since rolling over in bed would provoke her pain. Her pain is also provoked with rasing her hands up, to reach behind her head, thorax rotation and reaching behind her to grab something.   Past Medical History:  Diagnosis Date  . Glaucoma   . Hyperlipidemia   . Osteoporosis     There are no problems to display for this patient.   Past Surgical History:  Procedure Laterality Date  . CHOLECYSTECTOMY    . ROTATOR CUFF REPAIR Right   . TUBAL LIGATION    . UMBILICAL HERNIA REPAIR      OB History   No obstetric history on file.      Home Medications    Prior to Admission medications   Medication Sig Start Date End Date Taking? Authorizing Provider  cholecalciferol (VITAMIN D) 1000 UNITS tablet Take 1,000 Units by mouth daily.   Yes [provider]  ezetimibe-simvastatin (VYTORIN) 10-20 MG per tablet Take 1 tablet by mouth daily.   Yes [provider]  latanoprost (XALATAN) 0.005 % ophthalmic solution 1 drop at bedtime.   Yes [provider]  risedronate (ACTONEL) 35 MG tablet Take 35 mg by mouth every 7 (seven) days. with water on empty stomach, nothing by mouth or lie down for next 30 minutes.   Yes [provider]  naproxen (NAPROSYN) 500 MG tablet Take 1 tablet (500 mg total) by mouth 2 (two) times daily. 09/20/20   Rodriguez-Southworth, Nettie Elm, PA-C     Family History Family History  Problem Relation Age of Onset  . Diverticulitis Mother   . Heart attack Father 93  . Stroke Sister 76  . Heart attack Brother 67    Social History Social History   Tobacco Use  . Smoking status: Former Smoker    Packs/day: 1.00    Years: 6.00    Pack years: 6.00    Types: Cigarettes    Quit date: 09/20/1976    Years since quitting: 44.0  . Smokeless tobacco: Never Used  Vaping Use  . Vaping Use: Never used  Substance Use Topics  . Alcohol use: Not Currently    Alcohol/week: 0.0 standard drinks    Comment: occasionally- quit 03/18/20  . Drug use: No     Allergies   Patient has no known allergies.   Review of Systems Review of Systems  Constitutional: Negative for chills, diaphoresis and fever.  HENT: Negative for congestion.   Respiratory: Negative for cough, chest tightness and shortness of breath.   Cardiovascular: Negative for chest pain.  Musculoskeletal: Negative for gait problem and neck pain.  Skin: Negative for rash.     Physical Exam Triage Vital Signs ED Triage Vitals  Enc Vitals Group     BP 09/20/20 1452 (!) 141/86     Pulse Rate 09/20/20 1452 86     Resp 09/20/20 1452 18  Temp 09/20/20 1452 98.1 F (36.7 C)     Temp Source 09/20/20 1452 Oral     SpO2 09/20/20 1452 100 %     Weight 09/20/20 1454 124 lb (56.2 kg)     Height 09/20/20 1454 5\' 3"  (1.6 m)     Head Circumference --      Peak Flow --      Pain Score 09/20/20 1452 8     Pain Loc --      Pain Edu? --      Excl. in GC? --    No data found.  Updated Vital Signs BP (!) 141/86 (BP Location: Left Arm)   Pulse 86   Temp 98.1 F (36.7 C) (Oral)   Resp 18   Ht 5\' 3"  (1.6 m)   Wt 124 lb (56.2 kg)   SpO2 100%   BMI 21.97 kg/m   Visual Acuity Right Eye Distance:   Left Eye Distance:   Bilateral Distance:    Right Eye Near:   Left Eye Near:    Bilateral Near:     Physical Exam Vitals and nursing note reviewed.  Constitutional:       General: She is not in acute distress.    Appearance: She is normal weight. She is not toxic-appearing.  HENT:     Head: Normocephalic.  Eyes:     General: No scleral icterus.    Conjunctiva/sclera: Conjunctivae normal.  Pulmonary:     Effort: Pulmonary effort is normal.  Musculoskeletal:        General: Normal range of motion.     Cervical back: Neck supple.     Comments: BACK- has scoliosis. Has point tenderness on thoracic vertebrae T7-T9, but not tender on the muscles around it. Pain provoked with thoracic rotation, and after testing for upper extremity strength  Skin:    General: Skin is warm and dry.     Findings: No bruising, erythema or rash.  Neurological:     Mental Status: She is alert and oriented to person, place, and time.     Motor: No weakness.     Gait: Gait normal.  Psychiatric:        Mood and Affect: Mood normal.        Behavior: Behavior normal.        Thought Content: Thought content normal.        Judgment: Judgment normal.      UC Treatments / Results  Labs (all labs ordered are listed, but only abnormal results are displayed) Labs Reviewed - No data to display  EKG   Radiology DG Thoracic Spine 2 View  Result Date: 09/20/2020 CLINICAL DATA:  Interscapular pain since last evening EXAM: THORACIC SPINE 2 VIEWS COMPARISON:  None. FINDINGS: Frontal and lateral views of the lumbar spine are obtained. There is mild S shaped scoliosis. There is mild anterior wedging of the T7 vertebral body which appears chronic. No acute thoracic spine fractures. Mild diffuse spondylosis greatest at T7/T8, T8/T9, and T9/T10. The paraspinal soft tissues appear unremarkable. IMPRESSION: 1. Multilevel spondylosis and scoliosis. 2. Chronic appearing T7 anterior wedging. No evidence of acute fracture. Electronically Signed   By: M.D.   On: 09/20/2020 15:46    Procedures Procedures (including critical care time)  Medications Ordered in UC Medications - No data  to display  Initial Impression / Assessment and Plan / UC Course  I have reviewed the triage vital signs and the nursing notes. She has DDD of  T7-T9 and advised to take her Naprosyn which is over the counter and asked for Rx of this which I sent.  Pertinent  imaging results that were available during my care of the patient were reviewed by me and considered in my medical decision making (see chart for details).  Final Clinical Impressions(s) / UC Diagnoses   Final diagnoses:  Thoracic spine pain     Discharge Instructions     Your xray on the area of pain shows spondylosis or arthritis. Spondylosis is age-related change of the bones (vertebrae) and discs of the spine. These changes are often called degenerative disc disease and osteoarthritis. These changes don't always cause symptoms. But they are a common cause of spine problems that can range from mild to severe.  You may take your Naprosyn twice a day for 7 days to help with inflammation. If symptoms persist, please follow up with your family doctor and may need more tests done, which we cant order here.     ED Prescriptions    Medication Sig Dispense Auth. Provider   naproxen (NAPROSYN) 500 MG tablet Take 1 tablet (500 mg total) by mouth 2 (two) times daily. 30 tablet Rodriguez-Southworth, Nettie Elm, PA-C     PDMP not reviewed this encounter.   Garey Ham, [place] 09/20/20 [phone]

## 2020-12-02 ENCOUNTER — Ambulatory Visit (INDEPENDENT_AMBULATORY_CARE_PROVIDER_SITE_OTHER)
Admit: 2020-12-02 | Discharge: 2020-12-02 | Disposition: A | Discharge status: Home / Self Care | Payer: Medicare Other | Attending: Family Medicine | Admitting: Family Medicine

## 2020-12-02 ENCOUNTER — Encounter: Payer: Self-pay | Admitting: Emergency Medicine

## 2020-12-02 ENCOUNTER — Other Ambulatory Visit: Payer: Self-pay

## 2020-12-02 ENCOUNTER — Ambulatory Visit
Admission: EM | Admit: 2020-12-02 | Discharge: 2020-12-02 | Disposition: A | Discharge status: Home / Self Care | Payer: Medicare Other | Attending: Family Medicine | Admitting: Family Medicine

## 2020-12-02 DIAGNOSIS — I6529 Occlusion and stenosis of unspecified carotid artery: Secondary | ICD-10-CM | POA: Diagnosis present

## 2020-12-02 DIAGNOSIS — M810 Age-related osteoporosis without current pathological fracture: Secondary | ICD-10-CM | POA: Diagnosis present

## 2020-12-02 DIAGNOSIS — Z8249 Family history of ischemic heart disease and other diseases of the circulatory system: Secondary | ICD-10-CM

## 2020-12-02 DIAGNOSIS — K1121 Acute sialoadenitis: Secondary | ICD-10-CM | POA: Diagnosis not present

## 2020-12-02 DIAGNOSIS — K1120 Sialoadenitis, unspecified: Secondary | ICD-10-CM

## 2020-12-02 DIAGNOSIS — Z20822 Contact with and (suspected) exposure to covid-19: Secondary | ICD-10-CM | POA: Diagnosis present

## 2020-12-02 DIAGNOSIS — Z87891 Personal history of nicotine dependence: Secondary | ICD-10-CM

## 2020-12-02 DIAGNOSIS — H409 Unspecified glaucoma: Secondary | ICD-10-CM | POA: Diagnosis present

## 2020-12-02 DIAGNOSIS — N3281 Overactive bladder: Secondary | ICD-10-CM | POA: Diagnosis present

## 2020-12-02 DIAGNOSIS — G629 Polyneuropathy, unspecified: Secondary | ICD-10-CM | POA: Diagnosis present

## 2020-12-02 DIAGNOSIS — I700 Atherosclerosis of aorta: Secondary | ICD-10-CM | POA: Diagnosis present

## 2020-12-02 DIAGNOSIS — R221 Localized swelling, mass and lump, neck: Secondary | ICD-10-CM | POA: Diagnosis not present

## 2020-12-02 DIAGNOSIS — Z823 Family history of stroke: Secondary | ICD-10-CM

## 2020-12-02 DIAGNOSIS — A419 Sepsis, unspecified organism: Principal | ICD-10-CM | POA: Diagnosis present

## 2020-12-02 DIAGNOSIS — Z888 Allergy status to other drugs, medicaments and biological substances status: Secondary | ICD-10-CM

## 2020-12-02 DIAGNOSIS — E785 Hyperlipidemia, unspecified: Secondary | ICD-10-CM | POA: Diagnosis present

## 2020-12-02 DIAGNOSIS — K122 Cellulitis and abscess of mouth: Secondary | ICD-10-CM | POA: Diagnosis present

## 2020-12-02 LAB — PROTIME-INR
INR: 1 (ref 0.8–1.2)
Prothrombin Time: 12.5 seconds (ref 11.4–15.2)

## 2020-12-02 LAB — CBC WITH DIFFERENTIAL/PLATELET
Abs Immature Granulocytes: 0.04 10*3/uL (ref 0.00–0.07)
Basophils Absolute: 0.1 10*3/uL (ref 0.0–0.1)
Basophils Relative: 0 %
Eosinophils Absolute: 0 10*3/uL (ref 0.0–0.5)
Eosinophils Relative: 0 %
HCT: 45.7 % (ref 36.0–46.0)
Hemoglobin: 15.6 g/dL — ABNORMAL HIGH (ref 12.0–15.0)
Immature Granulocytes: 0 %
Lymphocytes Relative: 9 %
Lymphs Abs: 1.2 10*3/uL (ref 0.7–4.0)
MCH: 31.9 pg (ref 26.0–34.0)
MCHC: 34.1 g/dL (ref 30.0–36.0)
MCV: 93.5 fL (ref 80.0–100.0)
Monocytes Absolute: 0.8 10*3/uL (ref 0.1–1.0)
Monocytes Relative: 6 %
Neutro Abs: 11.6 10*3/uL — ABNORMAL HIGH (ref 1.7–7.7)
Neutrophils Relative %: 85 %
Platelets: 170 10*3/uL (ref 150–400)
RBC: 4.89 MIL/uL (ref 3.87–5.11)
RDW: 12.9 % (ref 11.5–15.5)
WBC: 13.6 10*3/uL — ABNORMAL HIGH (ref 4.0–10.5)
nRBC: 0 % (ref 0.0–0.2)

## 2020-12-02 LAB — COMPREHENSIVE METABOLIC PANEL
ALT: 22 U/L (ref 0–44)
AST: 29 U/L (ref 15–41)
Albumin: 4.9 g/dL (ref 3.5–5.0)
Alkaline Phosphatase: 90 U/L (ref 38–126)
Anion gap: 12 (ref 5–15)
BUN: 10 mg/dL (ref 8–23)
CO2: 25 mmol/L (ref 22–32)
Calcium: 9.5 mg/dL (ref 8.9–10.3)
Chloride: 104 mmol/L (ref 98–111)
Creatinine, Ser: 0.58 mg/dL (ref 0.44–1.00)
GFR, Estimated: >60 mL/min (ref >60)
Glucose, Bld: 121 mg/dL — ABNORMAL HIGH (ref 70–99)
Potassium: 3.8 mmol/L (ref 3.5–5.1)
Sodium: 141 mmol/L (ref 135–145)
Total Bilirubin: 1 mg/dL (ref 0.3–1.2)
Total Protein: 8.9 g/dL — ABNORMAL HIGH (ref 6.5–8.1)

## 2020-12-02 LAB — LACTIC ACID, PLASMA: Lactic Acid, Venous: 0.8 mmol/L (ref 0.5–1.9)

## 2020-12-02 MED ORDER — CEFTRIAXONE SODIUM 1 G IJ SOLR
1.0000 g | Freq: Once | INTRAMUSCULAR | Status: AC
  Administered 2020-12-02: 1 g via INTRAMUSCULAR

## 2020-12-02 MED ORDER — FENTANYL CITRATE (PF) 100 MCG/2ML IJ SOLN
50.0000 ug | INTRAMUSCULAR | Status: AC | PRN
  Administered 2020-12-02 – 2020-12-03 (×2): 50 ug via INTRAVENOUS
  Filled 2020-12-02 (×2): qty 2

## 2020-12-02 NOTE — ED Notes (Signed)
Patient is being discharged from the Urgent Care and sent [facility] via private vehicle. Per Dr. Adriana Simas, patient is in need of higher level of care due to needing to be admitted to hospital. Patient is aware and verbalizes understanding of plan of care.  Vitals:   12/02/20 1605  BP: (!) 173/106  Pulse: 100  Resp: 17  Temp: 98.3 F (36.8 C)  SpO2: 100%

## 2020-12-02 NOTE — Discharge Instructions (Signed)
Go directly to the ER.  I have already spoken with ENT and the ER.  Best of luck  Take care  Dr. Adriana Simas

## 2020-12-02 NOTE — ED Triage Notes (Signed)
Patient states that she started having throat swelling and under tongue swelling  Yesterday. Patient states that she has been unable to eat secondary to pain. States that she has been able to drink, hurts to swallow.

## 2020-12-02 NOTE — ED Provider Notes (Signed)
[facility]    CSN: [id number] Arrival date & time: 12/02/20  1501      History   Chief Complaint Chief Complaint  Patient presents with  . Mouth Pain   HPI   74 year old female presents with the above complaint.  Started yesterday. Patient reports difficulty swallowing as well as pain and swelling underneath the tongue.  Has difficulty swallowing.  Is able to tolerate liquids but not solids.  Pain 8/10 in severity.  No fever at this time.  She has taken ibuprofen without relief.  She has also taken Tylenol without significant improvement.  No other associated symptoms.  Past Medical History:  Diagnosis Date  . Glaucoma   . Hyperlipidemia   . Osteoporosis    Past Surgical History:  Procedure Laterality Date  . CHOLECYSTECTOMY    . ROTATOR CUFF REPAIR Right   . TUBAL LIGATION    . UMBILICAL HERNIA REPAIR     OB History   No obstetric history on file.    Home Medications    Prior to Admission medications   Medication Sig Start Date End Date Taking? Authorizing Provider  cholecalciferol (VITAMIN D) 1000 UNITS tablet Take 1,000 Units by mouth daily.   Yes [provider]  ezetimibe-simvastatin (VYTORIN) 10-20 MG per tablet Take 1 tablet by mouth daily.   Yes [provider]  latanoprost (XALATAN) 0.005 % ophthalmic solution 1 drop at bedtime.   Yes [provider]  naproxen (NAPROSYN) 500 MG tablet Take 1 tablet (500 mg total) by mouth 2 (two) times daily. 09/20/20  Yes Rodriguez-Southworth, Nettie Elm, PA-C  risedronate (ACTONEL) 35 MG tablet Take 35 mg by mouth every 7 (seven) days. with water on empty stomach, nothing by mouth or lie down for next 30 minutes.   Yes [provider]    Family History Family History  Problem Relation Age of Onset  . Diverticulitis Mother   . Heart attack Father 109  . Stroke Sister 73  . Heart attack Brother 56    Social History Social History   Tobacco Use  . Smoking status: Former  Smoker    Packs/day: 1.00    Years: 6.00    Pack years: 6.00    Types: Cigarettes    Quit date: 09/20/1976    Years since quitting: 44.2  . Smokeless tobacco: Never Used  Vaping Use  . Vaping Use: Never used  Substance Use Topics  . Alcohol use: Not Currently    Alcohol/week: 0.0 standard drinks    Comment: occasionally- quit 03/18/20  . Drug use: No     Allergies   Patient has no known allergies.   Review of Systems Review of Systems  HENT:       Swelling underneath the tongue.  Difficulty swallowing.  Neck -swelling/pain, left submandibular region.  Physical Exam Triage Vital Signs ED Triage Vitals  Enc Vitals Group     BP 12/02/20 1605 (!) 173/106     Pulse Rate 12/02/20 1605 100     Resp 12/02/20 1605 17     Temp 12/02/20 1605 98.3 F (36.8 C)     Temp Source 12/02/20 1605 Oral     SpO2 12/02/20 1605 100 %     Weight 12/02/20 1604 125 lb (56.7 kg)     Height 12/02/20 1604 5\' 3"  (1.6 m)     Head Circumference --      Peak Flow --      Pain Score 12/02/20 1604 8  Pain Loc --      Pain Edu? --      Excl. in GC? --    Updated Vital Signs BP (!) 173/106 (BP Location: Left Arm)   Pulse 100   Temp 98.3 F (36.8 C) (Oral)   Resp 17   Ht 5\' 3"  (1.6 m)   Wt 56.7 kg   SpO2 100%   BMI 22.14 kg/m   Visual Acuity Right Eye Distance:   Left Eye Distance:   Bilateral Distance:    Right Eye Near:   Left Eye Near:    Bilateral Near:     Physical Exam Vitals and nursing note reviewed.  Constitutional:      General: She is not in acute distress.    Comments: Is experiencing difficulty swallowing.  HENT:     Head: Normocephalic and atraumatic.     Mouth/Throat:     Comments: Extensive swelling noted underneath the tongue particular on the left side. Eyes:     General:        Right eye: No discharge.        Left eye: No discharge.     Conjunctiva/sclera: Conjunctivae normal.  Neck:     Comments: Submandibular mass noted on the left side.  Tender to  palpation. Pulmonary:     Effort: Pulmonary effort is normal. No respiratory distress.  Neurological:     Mental Status: She is alert.  Psychiatric:        Mood and Affect: Mood normal.        Behavior: Behavior normal.    UC Treatments / Results  Labs (all labs ordered are listed, but only abnormal results are displayed) Labs Reviewed - No data to display  EKG   Radiology CT Soft Tissue Neck Wo Contrast  Result Date: 12/02/2020 CLINICAL DATA:  Neck mass, initial workup. Additional history provided by scanning technologist: Patient reports throat swelling under tongue on left side yesterday, unable to eat secondary to pain, unable to drink, pain with swallowing. EXAM: CT NECK WITHOUT CONTRAST TECHNIQUE: Multidetector CT imaging of the neck was performed following the standard protocol without intravenous contrast. COMPARISON:  Neck CT 03/24/2016. carotid artery duplex 03/19/2016. FINDINGS: Pharynx and larynx: Streak artifact from dental restoration partially obscures the oral cavity. 3 mm calcific focus within the anterior floor of mouth on the left, compatible with a sialolith within the distal aspect of Wharton's duct. Surrounding inflammatory stranding within the left floor of mouth. Postinflammatory calcifications within the palatine tonsils bilaterally. The nasopharynx and larynx are unremarkable. Salivary glands: Enlarged left submandibular gland. Surrounding inflammatory stranding with extends caudally within the neck. The bilateral parotid and right submandibular glands are unremarkable. No sialolith is appreciated within the left submandibular gland itself. Thyroid: Diminutive but otherwise unremarkable. Lymph nodes: No pathologically enlarged cervical chain lymph nodes identified. Vascular: Calcified plaque within the visualized aortic arch and proximal major branch vessels of the neck. Moderate calcified plaque within the carotid bifurcations and proximal ICAs. Limited intracranial:  No acute intracranial abnormality identified. Visualized orbits: Incompletely imaged. No visible mass or acute finding. Mastoids and visualized paranasal sinuses: Mild ethmoid sinus mucosal thickening. Prominent soft tissue within the left nasal passage which may reflect mucosal thickening, but is nonspecific. Skeleton: No acute bony abnormality or aggressive osseous lesion. Remote C7 inferior endplate compression deformity. Upper chest: No consolidation within the imaged lung apices. Impression #1 will be called to the ordering clinician or representative by the Radiologist Assistant, and communication documented in the PACS or  [facility] Dashboard. IMPRESSION: Findings compatible with acute left submandibular gland sialoadenitis. 3 mm sialolith within the distal aspect of left Wharton's duct within the anterior floor of mouth. Associated inflammatory stranding within the floor of mouth, surrounding the left submandibular gland and extending caudally within the left neck suspicious for secondary floor of mouth infection and cellulitis. Aortic Atherosclerosis (ICD10-I70.0). Moderate calcified plaque within the carotid bifurcations and proximal ICAs. Mild ethmoid sinus mucosal thickening. Prominent soft tissue within the left nasal passage, which may reflect mucosal thickening but is nonspecific. Direct visualization is recommended. Electronically Signed   By: Jackey Loge DO   On: 12/02/2020 16:42    Procedures Procedures (including critical care time)  Medications Ordered in UC Medications  cefTRIAXone (ROCEPHIN) injection 1 g (has no administration in time range)    Initial Impression / Assessment and Plan / UC Course  I have reviewed the triage vital signs and the nursing notes.  Pertinent labs & imaging results that were available during my care of the patient were reviewed by me and considered in my medical decision making (see chart for details).    74 year old female presents with left  submandibular gland sialadenitis.  She has associated inflammatory stranding in the floor the mouth suspicious for secondary infection and cellulitis.  I have spoke with ENT, Dr. Andee Poles.  He reviewed her CT scan and recommended that I send her to the ER for IV antibiotics and admission to the hospital.  IM Rocephin was given prior to discharge per his recommendation.  I have also spoken with ER physician Dr. Erma Heritage about her case as well.  She is being transported by her friend directly to the ER.  Final Clinical Impressions(s) / UC Diagnoses   Final diagnoses:  Sialadenitis     Discharge Instructions     Go directly to the ER.  I have already spoken with ENT and the ER.  Best of luck  Take care  Dr. Adriana Simas    ED Prescriptions    None     PDMP not reviewed this encounter.   Tommie Sams, [place] 12/02/20 1729

## 2020-12-02 NOTE — ED Triage Notes (Signed)
Pt arrived via POV from [facility], pt was seen there for swelling in mouth, states for the past 4-5 days has been painful to eat or drink and swallow.  Pt reports swelling to lymph nodes on the L side.   Pt dx at [facility] with sialadenitis.  [facility] spoke with Dr. Erma Heritage already and as well as ENT MD Dr. Andee Poles and recommend patient for admission for IV antibiotics.  Pt did receive 1 dose of IM Rocephin while at [facility].

## 2020-12-02 NOTE — ED Provider Notes (Signed)
Received call from Everlene Other, MD. Patient is a 74 yo who is being sent with significant sialadenitis, difficulty eating/drinking. He has already discussed with Dr. Andee Poles. Recommends admission and IV ABX. Notified charge and triage RN.   Shaune Pollack, MD 12/02/20 [phone]

## 2020-12-03 ENCOUNTER — Inpatient Hospital Stay
Admission: EM | Admit: 2020-12-03 | Discharge: 2020-12-04 | DRG: 872 | Disposition: A | Discharge status: Home / Self Care | Payer: Medicare Other | Source: Ambulatory Visit | Attending: Internal Medicine | Admitting: Internal Medicine

## 2020-12-03 DIAGNOSIS — N3281 Overactive bladder: Secondary | ICD-10-CM | POA: Diagnosis present

## 2020-12-03 DIAGNOSIS — K122 Cellulitis and abscess of mouth: Secondary | ICD-10-CM | POA: Diagnosis present

## 2020-12-03 DIAGNOSIS — G629 Polyneuropathy, unspecified: Secondary | ICD-10-CM | POA: Diagnosis present

## 2020-12-03 DIAGNOSIS — I700 Atherosclerosis of aorta: Secondary | ICD-10-CM | POA: Diagnosis present

## 2020-12-03 DIAGNOSIS — A419 Sepsis, unspecified organism: Secondary | ICD-10-CM | POA: Diagnosis present

## 2020-12-03 DIAGNOSIS — E785 Hyperlipidemia, unspecified: Secondary | ICD-10-CM

## 2020-12-03 DIAGNOSIS — Z20822 Contact with and (suspected) exposure to covid-19: Secondary | ICD-10-CM | POA: Diagnosis present

## 2020-12-03 DIAGNOSIS — M810 Age-related osteoporosis without current pathological fracture: Secondary | ICD-10-CM | POA: Diagnosis present

## 2020-12-03 DIAGNOSIS — Z823 Family history of stroke: Secondary | ICD-10-CM | POA: Diagnosis not present

## 2020-12-03 DIAGNOSIS — D72828 Other elevated white blood cell count: Secondary | ICD-10-CM | POA: Diagnosis not present

## 2020-12-03 DIAGNOSIS — K1120 Sialoadenitis, unspecified: Secondary | ICD-10-CM | POA: Diagnosis not present

## 2020-12-03 DIAGNOSIS — K1121 Acute sialoadenitis: Secondary | ICD-10-CM

## 2020-12-03 DIAGNOSIS — Z8249 Family history of ischemic heart disease and other diseases of the circulatory system: Secondary | ICD-10-CM | POA: Diagnosis not present

## 2020-12-03 DIAGNOSIS — Z888 Allergy status to other drugs, medicaments and biological substances status: Secondary | ICD-10-CM | POA: Diagnosis not present

## 2020-12-03 DIAGNOSIS — H409 Unspecified glaucoma: Secondary | ICD-10-CM | POA: Diagnosis present

## 2020-12-03 DIAGNOSIS — D72829 Elevated white blood cell count, unspecified: Secondary | ICD-10-CM

## 2020-12-03 DIAGNOSIS — Z87891 Personal history of nicotine dependence: Secondary | ICD-10-CM | POA: Diagnosis not present

## 2020-12-03 DIAGNOSIS — I6529 Occlusion and stenosis of unspecified carotid artery: Secondary | ICD-10-CM | POA: Diagnosis present

## 2020-12-03 LAB — CBC
HCT: 40.9 % (ref 36.0–46.0)
Hemoglobin: 13.5 g/dL (ref 12.0–15.0)
MCH: 31.3 pg (ref 26.0–34.0)
MCHC: 33 g/dL (ref 30.0–36.0)
MCV: 94.7 fL (ref 80.0–100.0)
Platelets: 158 10*3/uL (ref 150–400)
RBC: 4.32 MIL/uL (ref 3.87–5.11)
RDW: 12.9 % (ref 11.5–15.5)
WBC: 14.6 10*3/uL — ABNORMAL HIGH (ref 4.0–10.5)
nRBC: 0 % (ref 0.0–0.2)

## 2020-12-03 LAB — RESP PANEL BY RT-PCR (FLU A&B, COVID) ARPGX2
Influenza A by PCR: NEGATIVE
Influenza B by PCR: NEGATIVE
SARS Coronavirus 2 by RT PCR: NEGATIVE

## 2020-12-03 LAB — BASIC METABOLIC PANEL
Anion gap: 9 (ref 5–15)
BUN: 10 mg/dL (ref 8–23)
CO2: 22 mmol/L (ref 22–32)
Calcium: 8.6 mg/dL — ABNORMAL LOW (ref 8.9–10.3)
Chloride: 105 mmol/L (ref 98–111)
Creatinine, Ser: 0.51 mg/dL (ref 0.44–1.00)
GFR, Estimated: >60 mL/min (ref >60)
Glucose, Bld: 141 mg/dL — ABNORMAL HIGH (ref 70–99)
Potassium: 4 mmol/L (ref 3.5–5.1)
Sodium: 136 mmol/L (ref 135–145)

## 2020-12-03 LAB — PROTIME-INR
INR: 1.1 (ref 0.8–1.2)
Prothrombin Time: 13.6 seconds (ref 11.4–15.2)

## 2020-12-03 LAB — PROCALCITONIN: Procalcitonin: <0.1 ng/mL

## 2020-12-03 MED ORDER — MORPHINE SULFATE (PF) 4 MG/ML IV SOLN
4.0000 mg | Freq: Once | INTRAVENOUS | Status: AC
  Administered 2020-12-03: 4 mg via INTRAVENOUS
  Filled 2020-12-03: qty 1

## 2020-12-03 MED ORDER — TRAZODONE HCL 50 MG PO TABS: 25.0000 mg | ORAL_TABLET | Freq: Every evening | ORAL | Status: DC | PRN

## 2020-12-03 MED ORDER — LACTATED RINGERS IV BOLUS
1000.0000 mL | Freq: Once | INTRAVENOUS | Status: AC
  Administered 2020-12-03: 1000 mL via INTRAVENOUS

## 2020-12-03 MED ORDER — ACETAMINOPHEN 650 MG RE SUPP: 650.0000 mg | Freq: Four times a day (QID) | RECTAL | Status: DC | PRN

## 2020-12-03 MED ORDER — SODIUM CHLORIDE 0.9 % IV SOLN
2.0000 g | Freq: Two times a day (BID) | INTRAVENOUS | Status: DC
  Administered 2020-12-03 – 2020-12-04 (×3): 2 g via INTRAVENOUS
  Filled 2020-12-03 (×2): qty 0.1
  Filled 2020-12-03 (×2): qty 2

## 2020-12-03 MED ORDER — SIMVASTATIN 20 MG PO TABS
20.0000 mg | ORAL_TABLET | Freq: Every day | ORAL | Status: DC
  Administered 2020-12-03 – 2020-12-04 (×2): 20 mg via ORAL
  Filled 2020-12-03: qty 1
  Filled 2020-12-03: qty 2
  Filled 2020-12-03: qty 1

## 2020-12-03 MED ORDER — ONDANSETRON HCL 4 MG/2ML IJ SOLN
4.0000 mg | INTRAMUSCULAR | Status: AC
  Administered 2020-12-03: 4 mg via INTRAVENOUS
  Filled 2020-12-03: qty 2

## 2020-12-03 MED ORDER — ENOXAPARIN SODIUM 40 MG/0.4ML ~~LOC~~ SOLN
40.0000 mg | SUBCUTANEOUS | Status: DC
  Administered 2020-12-03: 40 mg via SUBCUTANEOUS
  Filled 2020-12-03 (×2): qty 0.4

## 2020-12-03 MED ORDER — LATANOPROST 0.005 % OP SOLN
1.0000 [drp] | Freq: Every day | OPHTHALMIC | Status: DC
  Administered 2020-12-03: 23:00:00 1 [drp] via OPHTHALMIC
  Filled 2020-12-03 (×2): qty 2.5

## 2020-12-03 MED ORDER — LACTATED RINGERS IV SOLN: INTRAVENOUS | Status: AC

## 2020-12-03 MED ORDER — ONDANSETRON HCL 4 MG/2ML IJ SOLN: 4.0000 mg | Freq: Four times a day (QID) | INTRAMUSCULAR | Status: DC | PRN

## 2020-12-03 MED ORDER — VANCOMYCIN HCL IN DEXTROSE 1-5 GM/200ML-% IV SOLN: 1000.0000 mg | Freq: Once | INTRAVENOUS | Status: DC

## 2020-12-03 MED ORDER — EZETIMIBE-SIMVASTATIN 10-20 MG PO TABS: 1.0000 | ORAL_TABLET | Freq: Every day | ORAL | Status: DC

## 2020-12-03 MED ORDER — DEXAMETHASONE SODIUM PHOSPHATE 10 MG/ML IJ SOLN
8.0000 mg | Freq: Three times a day (TID) | INTRAMUSCULAR | Status: DC
  Administered 2020-12-03 – 2020-12-04 (×3): 8 mg via INTRAVENOUS
  Filled 2020-12-03 (×3): qty 1

## 2020-12-03 MED ORDER — DEXAMETHASONE SODIUM PHOSPHATE 4 MG/ML IJ SOLN: 4.0000 mg | Freq: Two times a day (BID) | INTRAMUSCULAR | Status: DC

## 2020-12-03 MED ORDER — DEXAMETHASONE SODIUM PHOSPHATE 10 MG/ML IJ SOLN
10.0000 mg | Freq: Once | INTRAMUSCULAR | Status: AC
  Administered 2020-12-03: 10 mg via INTRAVENOUS
  Filled 2020-12-03: qty 1

## 2020-12-03 MED ORDER — EZETIMIBE 10 MG PO TABS
10.0000 mg | ORAL_TABLET | Freq: Every day | ORAL | Status: DC
  Administered 2020-12-03 – 2020-12-04 (×2): 10 mg via ORAL
  Filled 2020-12-03 (×2): qty 1

## 2020-12-03 MED ORDER — VANCOMYCIN HCL 1250 MG/250ML IV SOLN
1250.0000 mg | Freq: Once | INTRAVENOUS | Status: AC
  Administered 2020-12-03: 1250 mg via INTRAVENOUS
  Filled 2020-12-03: qty 250

## 2020-12-03 MED ORDER — MORPHINE SULFATE (PF) 2 MG/ML IV SOLN
2.0000 mg | INTRAVENOUS | Status: DC | PRN
  Administered 2020-12-03: 2 mg via INTRAVENOUS
  Filled 2020-12-03: qty 1

## 2020-12-03 MED ORDER — SODIUM CHLORIDE 0.9 % IV SOLN: INTRAVENOUS | Status: DC

## 2020-12-03 MED ORDER — VITAMIN D 25 MCG (1000 UNIT) PO TABS
1000.0000 [IU] | ORAL_TABLET | Freq: Every day | ORAL | Status: DC
  Administered 2020-12-03 – 2020-12-04 (×2): 1000 [IU] via ORAL
  Filled 2020-12-03 (×2): qty 1

## 2020-12-03 MED ORDER — ONDANSETRON HCL 4 MG PO TABS: 4.0000 mg | ORAL_TABLET | Freq: Four times a day (QID) | ORAL | Status: DC | PRN

## 2020-12-03 MED ORDER — LACTATED RINGERS IV BOLUS (SEPSIS): 500.0000 mL | Freq: Once | INTRAVENOUS | Status: DC

## 2020-12-03 MED ORDER — ACETAMINOPHEN 325 MG PO TABS
650.0000 mg | ORAL_TABLET | Freq: Four times a day (QID) | ORAL | Status: DC | PRN
  Administered 2020-12-03 – 2020-12-04 (×3): 650 mg via ORAL
  Filled 2020-12-03 (×3): qty 2

## 2020-12-03 MED ORDER — SODIUM CHLORIDE 0.9 % IV BOLUS: 1000.0000 mL | Freq: Once | INTRAVENOUS | Status: DC

## 2020-12-03 MED ORDER — LACTATED RINGERS IV BOLUS (SEPSIS): 250.0000 mL | Freq: Once | INTRAVENOUS | Status: DC

## 2020-12-03 MED ORDER — LACTATED RINGERS IV BOLUS (SEPSIS)
1000.0000 mL | Freq: Once | INTRAVENOUS | Status: AC
  Administered 2020-12-03: 1000 mL via INTRAVENOUS

## 2020-12-03 MED ORDER — RISEDRONATE SODIUM 5 MG PO TABS: 35.0000 mg | ORAL_TABLET | ORAL | Status: DC

## 2020-12-03 MED ORDER — MAGNESIUM HYDROXIDE 400 MG/5ML PO SUSP
30.0000 mL | Freq: Every day | ORAL | Status: DC | PRN
  Filled 2020-12-03: qty 30

## 2020-12-03 MED ORDER — VANCOMYCIN HCL 500 MG/100ML IV SOLN
500.0000 mg | Freq: Two times a day (BID) | INTRAVENOUS | Status: DC
  Administered 2020-12-03 – 2020-12-04 (×2): 500 mg via INTRAVENOUS
  Filled 2020-12-03 (×4): qty 100

## 2020-12-03 MED ORDER — SODIUM CHLORIDE 0.9 % IV SOLN: 2.0000 g | Freq: Once | INTRAVENOUS | Status: DC

## 2020-12-03 NOTE — Progress Notes (Signed)
PHARMACY -  BRIEF ANTIBIOTIC NOTE   Pharmacy has received consult(s) for Vancomycin from an ED provider.  The patient's profile has been reviewed for ht/wt/allergies/indication/available labs.    One time order(s) placed for Vancomycin 1250 mg IV x 1  Further antibiotics/pharmacy consults should be ordered by admitting physician if indicated.                       Thank you, , D 12/03/2020  1:42 AM

## 2020-12-03 NOTE — Progress Notes (Signed)

## 2020-12-03 NOTE — H&P (Signed)
[place]   PATIENT NAME: Mindy Howe    MR#:  [id number]  DATE OF BIRTH:  June 17, 1946  DATE OF ADMISSION:  12/03/2020  PRIMARY CARE PHYSICIAN: Rayetta Humphrey, MD   REQUESTING/REFERRING PHYSICIAN: Loleta Rose, MD  CHIEF COMPLAINT:  Left neck pain and swelling  HISTORY OF PRESENT ILLNESS:  Mindy Howe  is a 74 y.o. Caucasian female with a known history of dyslipidemia, glaucoma and osteoporosis, presented to the emergency room with acute onset of left neck pain and swelling for which she presented to urgent care.  This has been worsening recently over the last 4-5 days and left neck pain has been currently severe and the swelling below her jaw has been giving her difficulty with eating and drinking.  She has been having low-grade fever without chills.  No tooth ache.  No neck stiffness.  She denies any nausea or vomiting abdominal pain or diarrhea.  No dyspnea or cough or wheezing.  No tongue or bladder swelling.  She has been having difficulty swallowing and pain though when he opens his mouth.  The patient initially went to urgent care and was evaluated by Dr. Adriana Simas who obtained the CT of the neck with the below findings.  He then talked over the phone with Dr. Andee Poles with ENT who recommended ER presentation for admission.  Upon presentation to the ER, blood pressure was 173/106 with otherwise normal vital signs.  Labs revealed unremarkable CMP except for total protein of 8.9 and albumin of 4.9 with lactic acid 0.8.  CBC showed leukocytosis 13.6 with neutrophilia as well as hemoconcentration.  Influenza antigens RSV and COVID-19 PCR came back negative.  Blood cultures were drawn.  CT soft tissue neck revealed acute left submandibular sialoadenitis with 3 mm sialolith within the distal aspect of left Wharton's duct within the anterior floor of the mouth with inflammatory stranding within the floor of mouth surrounding the left submandibular gland and extending caudally within the  left neck suspicious for secondary floor of mouth infection and cellulitis.  It showed aortic atherosclerosis and moderate calcified plaque in the carotid bifurcations of proximal ICAs as well as mild ethmoid sinus mucosal thickening and prominent soft tissue within the left nasal passage that may reflect nonspecific mucosal thickening.  The patient was given 10 mg of IV Decadron, 50 mcg of IV fentanyl, 1 L bolus of IV normal saline twice, 4 mg of IV morphine sulfate, 4 mg of IV Zofran and 1250 mg of IV vancomycin.  She will be admitted to a medical bed for further evaluation and management.   PAST MEDICAL HISTORY:   Past Medical History:  Diagnosis Date  . Glaucoma   . Hyperlipidemia   . Osteoporosis     PAST SURGICAL HISTORY:   Past Surgical History:  Procedure Laterality Date  . CHOLECYSTECTOMY    . ROTATOR CUFF REPAIR Right   . TUBAL LIGATION    . UMBILICAL HERNIA REPAIR      SOCIAL HISTORY:   Social History   Tobacco Use  . Smoking status: Former Smoker    Packs/day: 1.00    Years: 6.00    Pack years: 6.00    Types: Cigarettes    Quit date: 09/20/1976    Years since quitting: 44.2  . Smokeless tobacco: Never Used  Substance Use Topics  . Alcohol use: Not Currently    Alcohol/week: 0.0 standard drinks    Comment: occasionally- quit 03/18/20    FAMILY HISTORY:   Family  History  Problem Relation Age of Onset  . Diverticulitis Mother   . Heart attack Father 1554  . Stroke Sister 8874  . Heart attack Brother 7657    DRUG ALLERGIES:   Allergies  Allergen Reactions  . Solifenacin     Other reaction(s): Headache    REVIEW OF SYSTEMS:   ROS As per history of present illness. All pertinent systems were reviewed above. Constitutional, HEENT, cardiovascular, respiratory, GI, GU, musculoskeletal, neuro, psychiatric, endocrine, integumentary and hematologic systems were reviewed and are otherwise negative/unremarkable except for positive findings mentioned above in the  HPI.   MEDICATIONS AT HOME:   Prior to Admission medications   Medication Sig Start Date End Date Taking? Authorizing Provider  cholecalciferol (VITAMIN D) 1000 UNITS tablet Take 1,000 Units by mouth daily.    [provider]  ezetimibe-simvastatin (VYTORIN) 10-20 MG per tablet Take 1 tablet by mouth daily.    [provider]  latanoprost (XALATAN) 0.005 % ophthalmic solution 1 drop at bedtime.    [provider]  naproxen (NAPROSYN) 500 MG tablet Take 1 tablet (500 mg total) by mouth 2 (two) times daily. 09/20/20   Rodriguez-Southworth, Nettie ElmSylvia, PA-C  risedronate (ACTONEL) 35 MG tablet Take 35 mg by mouth every 7 (seven) days. with water on empty stomach, nothing by mouth or lie down for next 30 minutes.    [provider]      VITAL SIGNS:  Blood pressure (!) 152/81, pulse (!) 113, temperature 99.5 F (37.5 C), temperature source Oral, resp. rate 18, height 5\' 3"  (1.6 m), weight 56.7 kg, SpO2 96 %.  PHYSICAL EXAMINATION:  Physical Exam  GENERAL:  74 y.o.-year-old Caucasian female patient lying in the bed with no acute distress.  EYES: Pupils equal, round, reactive to light and accommodation. No scleral icterus. Extraocular muscles intact.  HEENT: Head atraumatic, normocephalic. Oropharynx with left sublingual tenderness and swelling and nasopharynx clear..  No diffuse  tongue or palatal swelling. NECK:  Supple, no jugular venous distention. No thyroid enlargement.  She has a left submandibular diffuse neck swelling with induration and tenderness and warmth and that corresponds with mild swelling and tenderness on her mouth lower to the left side under her tongue. LUNGS: Normal breath sounds bilaterally, no wheezing, rales,rhonchi or crepitation. No use of accessory muscles of respiration.  CARDIOVASCULAR: Regular rate and rhythm, S1, S2 normal. No murmurs, rubs, or gallops.  ABDOMEN: Soft, nondistended, nontender. Bowel sounds present. No organomegaly  or mass.  EXTREMITIES: No pedal edema, cyanosis, or clubbing.  NEUROLOGIC: Cranial nerves II through XII are intact. Muscle strength 5/5 in all extremities. Sensation intact. Gait not checked.  PSYCHIATRIC: The patient is alert and oriented x 3.  Normal affect and good eye contact. SKIN: As above otherwise no obvious other rashes, lesions, or ulcers.     LABORATORY PANEL:   CBC Recent Labs  Lab 12/02/20 1940  WBC 13.6*  HGB 15.6*  HCT 45.7  PLT 170   ------------------------------------------------------------------------------------------------------------------  Chemistries  Recent Labs  Lab 12/02/20 1940  NA 141  K 3.8  CL 104  CO2 25  GLUCOSE 121*  BUN 10  CREATININE 0.58  CALCIUM 9.5  AST 29  ALT 22  ALKPHOS 90  BILITOT 1.0   ------------------------------------------------------------------------------------------------------------------  Cardiac Enzymes No results for input(s): TROPONINI in the last 168 hours. ------------------------------------------------------------------------------------------------------------------  RADIOLOGY:  CT Soft Tissue Neck Wo Contrast  Result Date: 12/02/2020 CLINICAL DATA:  Neck mass, initial workup. Additional history provided by scanning technologist: Patient reports  throat swelling under tongue on left side yesterday, unable to eat secondary to pain, unable to drink, pain with swallowing. EXAM: CT NECK WITHOUT CONTRAST TECHNIQUE: Multidetector CT imaging of the neck was performed following the standard protocol without intravenous contrast. COMPARISON:  Neck CT 03/24/2016. carotid artery duplex 03/19/2016. FINDINGS: Pharynx and larynx: Streak artifact from dental restoration partially obscures the oral cavity. 3 mm calcific focus within the anterior floor of mouth on the left, compatible with a sialolith within the distal aspect of Wharton's duct. Surrounding inflammatory stranding within the left floor of mouth.  Postinflammatory calcifications within the palatine tonsils bilaterally. The nasopharynx and larynx are unremarkable. Salivary glands: Enlarged left submandibular gland. Surrounding inflammatory stranding with extends caudally within the neck. The bilateral parotid and right submandibular glands are unremarkable. No sialolith is appreciated within the left submandibular gland itself. Thyroid: Diminutive but otherwise unremarkable. Lymph nodes: No pathologically enlarged cervical chain lymph nodes identified. Vascular: Calcified plaque within the visualized aortic arch and proximal major branch vessels of the neck. Moderate calcified plaque within the carotid bifurcations and proximal ICAs. Limited intracranial: No acute intracranial abnormality identified. Visualized orbits: Incompletely imaged. No visible mass or acute finding. Mastoids and visualized paranasal sinuses: Mild ethmoid sinus mucosal thickening. Prominent soft tissue within the left nasal passage which may reflect mucosal thickening, but is nonspecific. Skeleton: No acute bony abnormality or aggressive osseous lesion. Remote C7 inferior endplate compression deformity. Upper chest: No consolidation within the imaged lung apices. Impression #1 will be called to the ordering clinician or representative by the Radiologist Assistant, and communication documented in the PACS or [employer]. IMPRESSION: Findings compatible with acute left submandibular gland sialoadenitis. 3 mm sialolith within the distal aspect of left Wharton's duct within the anterior floor of mouth. Associated inflammatory stranding within the floor of mouth, surrounding the left submandibular gland and extending caudally within the left neck suspicious for secondary floor of mouth infection and cellulitis. Aortic Atherosclerosis (ICD10-I70.0). Moderate calcified plaque within the carotid bifurcations and proximal ICAs. Mild ethmoid sinus mucosal thickening. Prominent soft tissue  within the left nasal passage, which may reflect mucosal thickening but is nonspecific. Direct visualization is recommended. Electronically Signed   By: Jackey Loge DO   On: 12/02/2020 16:42      IMPRESSION AND PLAN:   1.  Left submandibular gland sialoadenitis with associated severe cellulitis of the floor the mouth and subsequent sepsis without severe sepsis or septic shock.  This is manifested by leukocytosis and tachycardia. -The patient will be admitted to a medical bed. -We will continue her on IV vancomycin and Zosyn. -Pain management will be provided. -ENT consult will be obtained. -Dr. Alessandra Bevels was notified and is aware about the patient. -We will follow blood cultures. -The patient will be hydrated with IV normal saline. -We will continue IV Decadron.  2.  Dyslipidemia. -We will continue Vytorin.  3.  Peripheral neuropathy. -We will continue Neurontin.  4.  Overactive bladder. -We will continue Vesicare.  5.  Osteoporosis. -Actonel will be continued.  6.  DVT prophylaxis. -Subcutaneous Lovenox.    All the records are reviewed and case discussed with ED provider. The plan of care was discussed in details with the patient (and family). I answered all questions. The patient agreed to proceed with the above mentioned plan. Further management will depend upon hospital course.  CODE STATUS: Full code  Status is: Inpatient  Remains inpatient appropriate because:Ongoing active pain requiring inpatient pain management, Ongoing diagnostic testing needed not appropriate for  outpatient work up, Unsafe d/c plan, IV treatments appropriate due to intensity of illness or inability to take PO and Inpatient level of care appropriate due to severity of illness   Dispo: The patient is from: Home              Anticipated d/c is to: Home              Anticipated d/c date is: 2 days              Patient currently is not medically stable to d/c.  TOTAL TIME TAKING CARE OF THIS  PATIENT: 55 minutes.    Hannah Beat M.D on 12/03/2020 at 1:41 AM  [facility]   From 7 PM-7 AM, contact night-coverage [email or web address]  CC: Primary care physician; Rayetta Humphrey, MD

## 2020-12-03 NOTE — Progress Notes (Signed)
CODE SEPSIS - PHARMACY COMMUNICATION  **Broad Spectrum Antibiotics should be administered within 1 hour of Sepsis diagnosis**  Time Code Sepsis Called/Page Received: 12/6 @ 0102  Antibiotics Ordered: Vanc, Cefepime   Time of 1st antibiotic administration:  Ceftriaxone 1 gm IV X 1 on 12/5 @ 1711   Additional action taken by pharmacy:   If necessary, Name of Provider/Nurse Contacted:     , D ,PharmD Clinical Pharmacist  12/03/2020  1:41 AM

## 2020-12-03 NOTE — Progress Notes (Signed)
PROGRESS NOTE    Mindy Howe  GGY:[id number] DOB: [birth year] DOA: 12/03/2020 PCP: Rayetta Humphrey, MD   Assessment & Plan:   Active Problems:   Sepsis (HCC)   Left submandibular gland sialoadenitis: with associated severe cellulitis of the floor the mouth and subsequent sepsis. Meets critieria w/ leukocytosis, tachycardia.  Continue on IV vanco & cefepime. ENT following and recs apprec. Blood cxs are pending. Continue on IV decadron. Continue on IVFs.   Leukocytosis: likely secondary to above infection. Continue on IV abxs  HLD: continue on home dose of ezetimibe/simvastatin  Overactive bladder: will hold home dose of vesicare   Osteoporosis: will hold home dose of risedronate     DVT prophylaxis: lovenox Code Status: full  Family Communication: Disposition Plan:  Likely d/c back home   Status is: Inpatient  Remains inpatient appropriate because:IV treatments appropriate due to intensity of illness or inability to take PO and Inpatient level of care appropriate due to severity of illness   Dispo: The patient is from: Home              Anticipated d/c is to: Home              Anticipated d/c date is: 2 days              Patient currently is not medically stable to d/c.         Consultants:   ENT   Procedures:    Antimicrobials: vanco, cefepime    Subjective: Pt c/o jaw pain   Objective: Vitals:   12/03/20 0600 12/03/20 0605 12/03/20 0615 12/03/20 0630  BP: (!) 151/98     Pulse: 98 (!) 105 97 95  Resp:      Temp:      TempSrc:      SpO2: 95% 96% 95% 96%  Weight:      Height:       No intake or output data in the 24 hours ending 12/03/20 0814 Filed Weights   12/02/20 1926  Weight: 56.7 kg    Examination:  General exam: Appears calm but uncomfortable  Respiratory system: Clear to auscultation. Respiratory effort normal. Cardiovascular system: S1 & S2 +. No rubs, gallops or clicks.  Gastrointestinal system: Abdomen is  nondistended, soft and nontender. Normal bowel sounds heard. Central nervous system: Alert and oriented. Moves all 4 extremities  Psychiatry: Judgement and insight appear normal. Flat mood and affect.  Skin: left submandibular area edematous & erythematous    Data Reviewed: I have personally reviewed following labs and imaging studies  CBC: Recent Labs  Lab 12/02/20 1940 12/03/20 0328  WBC 13.6* 14.6*  NEUTROABS 11.6*  --   HGB 15.6* 13.5  HCT 45.7 40.9  MCV 93.5 94.7  PLT 170 158   Basic Metabolic Panel: Recent Labs  Lab 12/02/20 1940 12/03/20 0328  NA 141 136  K 3.8 4.0  CL 104 105  CO2 25 22  GLUCOSE 121* 141*  BUN 10 10  CREATININE 0.58 0.51  CALCIUM 9.5 8.6*   GFR: Estimated Creatinine Clearance: 50.3 mL/min (by C-G formula based on SCr of 0.51 mg/dL). Liver Function Tests: Recent Labs  Lab 12/02/20 1940  AST 29  ALT 22  ALKPHOS 90  BILITOT 1.0  PROT 8.9*  ALBUMIN 4.9   No results for input(s): LIPASE, AMYLASE in the last 168 hours. No results for input(s): AMMONIA in the last 168 hours. Coagulation Profile: Recent Labs  Lab 12/02/20 1940 12/03/20 0328  INR  1.0 1.1   Cardiac Enzymes: No results for input(s): CKTOTAL, CKMB, CKMBINDEX, TROPONINI in the last 168 hours. BNP (last 3 results) No results for input(s): PROBNP in the last 8760 hours. HbA1C: No results for input(s): HGBA1C in the last 72 hours. CBG: No results for input(s): GLUCAP in the last 168 hours. Lipid Profile: No results for input(s): CHOL, HDL, LDLCALC, TRIG, CHOLHDL, LDLDIRECT in the last 72 hours. Thyroid Function Tests: No results for input(s): TSH, T4TOTAL, FREET4, T3FREE, THYROIDAB in the last 72 hours. Anemia Panel: No results for input(s): VITAMINB12, FOLATE, FERRITIN, TIBC, IRON, RETICCTPCT in the last 72 hours. Sepsis Labs: Recent Labs  Lab 12/02/20 1940 12/03/20 0328  PROCALCITON  --  <0.10  LATICACIDVEN 0.8  --     Recent Results (from the past 240 hour(s))   Resp Panel by RT-PCR (Flu A&B, Covid) Nasopharyngeal Swab     Status: None   Collection Time: 12/02/20 11:10 PM   Specimen: Nasopharyngeal Swab; Nasopharyngeal(NP) swabs in vial transport medium  Result Value Ref Range Status   SARS Coronavirus 2 by RT PCR NEGATIVE NEGATIVE Final    Comment: (NOTE) SARS-CoV-2 target nucleic acids are NOT DETECTED.  The SARS-CoV-2 RNA is generally detectable in upper respiratory specimens during the acute phase of infection. The lowest concentration of SARS-CoV-2 viral copies this assay can detect is 138 copies/mL. A negative result does not preclude SARS-Cov-2 infection and should not be used as the sole basis for treatment or other patient management decisions. A negative result may occur with  improper specimen collection/handling, submission of specimen other than nasopharyngeal swab, presence of viral mutation(s) within the areas targeted by this assay, and inadequate number of viral copies(<138 copies/mL). A negative result must be combined with clinical observations, patient history, and epidemiological information. The expected result is Negative.  Fact Sheet for Patients:  BloggerCourse.com  Fact Sheet for Healthcare Providers:  SeriousBroker.it  This test is no t yet approved or cleared by the [place] FDA and  has been authorized for detection and/or diagnosis of SARS-CoV-2 by FDA under an Emergency Use Authorization (EUA). This EUA will remain  in effect (meaning this test can be used) for the duration of the COVID-19 declaration under Section 564(b)(1) of the Act, 21 U.S.C.section 360bbb-3(b)(1), unless the authorization is terminated  or revoked sooner.       Influenza A by PCR NEGATIVE NEGATIVE Final   Influenza B by PCR NEGATIVE NEGATIVE Final    Comment: (NOTE) The Xpert Xpress SARS-CoV-2/FLU/RSV plus assay is intended as an aid in the diagnosis of influenza from  Nasopharyngeal swab specimens and should not be used as a sole basis for treatment. Nasal washings and aspirates are unacceptable for Xpert Xpress SARS-CoV-2/FLU/RSV testing.  Fact Sheet for Patients: BloggerCourse.com  Fact Sheet for Healthcare Providers: SeriousBroker.it  This test is not yet approved or cleared by the [place] FDA and has been authorized for detection and/or diagnosis of SARS-CoV-2 by FDA under an Emergency Use Authorization (EUA). This EUA will remain in effect (meaning this test can be used) for the duration of the COVID-19 declaration under Section 564(b)(1) of the Act, 21 U.S.C. section 360bbb-3(b)(1), unless the authorization is terminated or revoked.  Performed at [facility], [address] [place], [place] [address]          Radiology Studies: CT Soft Tissue Neck Wo Contrast  Result Date: 12/02/2020 CLINICAL DATA:  Neck mass, initial workup. Additional history provided by scanning technologist: Patient reports throat swelling  under tongue on left side yesterday, unable to eat secondary to pain, unable to drink, pain with swallowing. EXAM: CT NECK WITHOUT CONTRAST TECHNIQUE: Multidetector CT imaging of the neck was performed following the standard protocol without intravenous contrast. COMPARISON:  Neck CT 03/24/2016. carotid artery duplex 03/19/2016. FINDINGS: Pharynx and larynx: Streak artifact from dental restoration partially obscures the oral cavity. 3 mm calcific focus within the anterior floor of mouth on the left, compatible with a sialolith within the distal aspect of Wharton's duct. Surrounding inflammatory stranding within the left floor of mouth. Postinflammatory calcifications within the palatine tonsils bilaterally. The nasopharynx and larynx are unremarkable. Salivary glands: Enlarged left submandibular gland. Surrounding inflammatory stranding with extends caudally within the  neck. The bilateral parotid and right submandibular glands are unremarkable. No sialolith is appreciated within the left submandibular gland itself. Thyroid: Diminutive but otherwise unremarkable. Lymph nodes: No pathologically enlarged cervical chain lymph nodes identified. Vascular: Calcified plaque within the visualized aortic arch and proximal major branch vessels of the neck. Moderate calcified plaque within the carotid bifurcations and proximal ICAs. Limited intracranial: No acute intracranial abnormality identified. Visualized orbits: Incompletely imaged. No visible mass or acute finding. Mastoids and visualized paranasal sinuses: Mild ethmoid sinus mucosal thickening. Prominent soft tissue within the left nasal passage which may reflect mucosal thickening, but is nonspecific. Skeleton: No acute bony abnormality or aggressive osseous lesion. Remote C7 inferior endplate compression deformity. Upper chest: No consolidation within the imaged lung apices. Impression #1 will be called to the ordering clinician or representative by the Radiologist Assistant, and communication documented in the PACS or [employer]. IMPRESSION: Findings compatible with acute left submandibular gland sialoadenitis. 3 mm sialolith within the distal aspect of left Wharton's duct within the anterior floor of mouth. Associated inflammatory stranding within the floor of mouth, surrounding the left submandibular gland and extending caudally within the left neck suspicious for secondary floor of mouth infection and cellulitis. Aortic Atherosclerosis (ICD10-I70.0). Moderate calcified plaque within the carotid bifurcations and proximal ICAs. Mild ethmoid sinus mucosal thickening. Prominent soft tissue within the left nasal passage, which may reflect mucosal thickening but is nonspecific. Direct visualization is recommended. Electronically Signed   By: Jackey Loge DO   On: 12/02/2020 16:42        Scheduled Meds: . cholecalciferol   1,000 Units Oral Daily  . dexamethasone (DECADRON) injection  4 mg Intravenous Q12H  . enoxaparin (LOVENOX) injection  40 mg Subcutaneous Q24H  . ezetimibe-simvastatin  1 tablet Oral Daily  . latanoprost  1 drop Both Eyes QHS   Continuous Infusions: . sodium chloride    . ceFEPime (MAXIPIME) IV Stopped (12/03/20 0409)  . lactated ringers 150 mL/hr at 12/03/20 0604  . vancomycin       LOS: 0 days    Time spent: 32 mins    Charise Killian, MD [facility] Pager [phone]  If 7PM-7AM, please contact night-coverage 12/03/2020, 8:14 AM

## 2020-12-03 NOTE — Progress Notes (Signed)
Pharmacy Antibiotic Note  Mindy Howe is a 74 y.o. female admitted on 12/03/2020 with sepsis.  Pharmacy has been consulted for Vancomycin, Cefepime  dosing.  Plan: Vancomycin 1250 mg IV X 1 ordered to be given on 12/6 @ 0200. Vancomycin 500 mg IV Q12H ordered to be start on 12/6 @ 1400.  Cefepime 2 gm IV Q12H ordered to start on 12/6 @ 0200.   Height: 5\' 3"  (160 cm) Weight: 56.7 kg (125 lb) IBW/kg (Calculated) : 52.4  Temp (24hrs), Avg:99.1 F (37.3 C), Min:98.3 F (36.8 C), Max:99.5 F (37.5 C)  Recent Labs  Lab 12/02/20 1940  WBC 13.6*  CREATININE 0.58  LATICACIDVEN 0.8    Estimated Creatinine Clearance: 50.3 mL/min (by C-G formula based on SCr of 0.58 mg/dL).    Allergies  Allergen Reactions  . Solifenacin     Other reaction(s): Headache    Antimicrobials this admission:   >>    >>   Dose adjustments this admission:   Microbiology results:  BCx:   UCx:    Sputum:    MRSA PCR:   Thank you for allowing pharmacy to be a part of this patient's care.  , D 12/03/2020 1:48 AM

## 2020-12-03 NOTE — ED Provider Notes (Signed)
[facility] Emergency Department Provider Note  ____________________________________________   First MD Initiated Contact with Patient 12/03/20 0045     (approximate)  I have reviewed the triage vital signs and the nursing notes.   HISTORY  Chief Complaint No chief complaint on file.    HPI Mindy Howe is a 74 y.o. female with relatively minimal past medical history who is generally healthy and active who presents from the urgent care for evaluation of neck swelling and pain.  Reportedly patient has had gradually worsening but now severe left-sided neck pain and swelling below the jaw which is making it difficult for her to eat and drink.  She denies chills but has had what she describes as "low-grade fevers" in the 99 range but says her normal is around 97.  She has not been able to eat or drink anything for a couple of days due to the amount of pain when she tries to do so.  She has no dental problems.  She denies neck pain/stiffness except for the pain in the left side of her anterior neck below the jaw, chest pain, shortness of breath, nausea, vomiting, abdominal pain, and dysuria.  No recent traumatic injury.  It is painful when she opens her mouth.  No substantial swelling of her tongue of which she is aware.  She went to the urgent care and was evaluated by Dr. Adriana Simas who obtained a CT scan of the neck and found substantial sialoadenitis.  He talked by phone with Dr. Andee Poles with ENT who recommended admission to the hospital for IV antibiotics and fluids.  Dr. Erma Heritage in the emergency department was informed and the patient has been waiting since that time due to overwhelming ED and hospital patient volumes.        Past Medical History:  Diagnosis Date  . Glaucoma   . Hyperlipidemia   . Osteoporosis     There are no problems to display for this patient.   Past Surgical History:  Procedure Laterality Date  . CHOLECYSTECTOMY    . ROTATOR  CUFF REPAIR Right   . TUBAL LIGATION    . UMBILICAL HERNIA REPAIR      Prior to Admission medications   Medication Sig Start Date End Date Taking? Authorizing Provider  cholecalciferol (VITAMIN D) 1000 UNITS tablet Take 1,000 Units by mouth daily.    [provider]  ezetimibe-simvastatin (VYTORIN) 10-20 MG per tablet Take 1 tablet by mouth daily.    [provider]  latanoprost (XALATAN) 0.005 % ophthalmic solution 1 drop at bedtime.    [provider]  naproxen (NAPROSYN) 500 MG tablet Take 1 tablet (500 mg total) by mouth 2 (two) times daily. 09/20/20   Rodriguez-Southworth, Nettie Elm, PA-C  risedronate (ACTONEL) 35 MG tablet Take 35 mg by mouth every 7 (seven) days. with water on empty stomach, nothing by mouth or lie down for next 30 minutes.    [provider]    Allergies Solifenacin  Family History  Problem Relation Age of Onset  . Diverticulitis Mother   . Heart attack Father 58  . Stroke Sister 53  . Heart attack Brother 56    Social History Social History   Tobacco Use  . Smoking status: Former Smoker    Packs/day: 1.00    Years: 6.00    Pack years: 6.00    Types: Cigarettes    Quit date: 09/20/1976    Years since quitting: 44.2  . Smokeless tobacco: Never Used  Vaping Use  . Vaping Use: Never used  Substance Use Topics  . Alcohol use: Not Currently    Alcohol/week: 0.0 standard drinks    Comment: occasionally- quit 03/18/20  . Drug use: No    Review of Systems Constitutional: "Low-grade fever". Eyes: No visual changes. ENT: Sore throat, left-sided neck pain and swelling, difficulty swallowing, decreased oral intake. Cardiovascular: Denies chest pain. Respiratory: Denies shortness of breath. Gastrointestinal: No abdominal pain.  No nausea, no vomiting.  No diarrhea.  No constipation. Genitourinary: Negative for dysuria. Musculoskeletal: Negative for back pain. Integumentary: Negative for rash. Neurological: Negative for  headaches, focal weakness or numbness.   ____________________________________________   PHYSICAL EXAM:  VITAL SIGNS: ED Triage Vitals  Enc Vitals Group     BP 12/02/20 1924 (!) 157/81     Pulse Rate 12/02/20 1924 (!) 108     Resp 12/02/20 1924 18     Temp 12/02/20 1924 99.5 F (37.5 C)     Temp Source 12/02/20 1924 Oral     SpO2 12/02/20 1924 96 %     Weight 12/02/20 1926 56.7 kg (125 lb)     Height 12/02/20 1926 1.6 m (5\' 3" )     Head Circumference --      Peak Flow --      Pain Score 12/02/20 1924 8     Pain Loc --      Pain Edu? --      Excl. in GC? --     Constitutional: Alert and oriented.  Eyes: Conjunctivae are normal.  Head: Atraumatic. Nose: No congestion/rhinnorhea. Mouth/Throat: No intraoral lesions identified.  The patient has some trismus on exam but has a patent airway.  Tender to palpation beneath the tongue but not consistent with Ludwig's angina.  Tolerating secretions but it is often severely painful for her to swallow.  Mucous membranes are dry. Neck: No stridor.  Visible and easily palpable left submandibular swelling, firm, tender. Cardiovascular: Tachycardia, regular rhythm. Good peripheral circulation. Grossly normal heart sounds. Respiratory: Normal respiratory effort.  No retractions. Gastrointestinal: Soft and nontender. No distention.  Musculoskeletal: No lower extremity tenderness nor edema. No gross deformities of extremities. Neurologic:  Normal speech and language. No gross focal neurologic deficits are appreciated.  Skin:  Skin is warm, dry and intact. Psychiatric: Mood and affect are normal. Speech and behavior are normal.  ____________________________________________   LABS (all labs ordered are listed, but only abnormal results are displayed)  Labs Reviewed  COMPREHENSIVE METABOLIC PANEL - Abnormal; Notable for the following components:      Result Value   Glucose, Bld 121 (*)    Total Protein 8.9 (*)    All other components  within normal limits  CBC WITH DIFFERENTIAL/PLATELET - Abnormal; Notable for the following components:   WBC 13.6 (*)    Hemoglobin 15.6 (*)    Neutro Abs 11.6 (*)    All other components within normal limits  RESP PANEL BY RT-PCR (FLU A&B, COVID) ARPGX2  CULTURE, BLOOD (ROUTINE X 2)  CULTURE, BLOOD (ROUTINE X 2)  LACTIC ACID, PLASMA  PROTIME-INR   ____________________________________________  EKG  No indication for emergent EKG ____________________________________________  RADIOLOGY 14/05/21, personally viewed and evaluated these images (plain radiographs) as part of my medical decision making, as well as reviewing the written report by the radiologist.  ED MD interpretation:  sialoadenitis with a 3 mm sialolith within the distal aspect of the left duct with extensive inflammatory changes.  Official radiology report(s): CT Soft  Tissue Neck Wo Contrast  Result Date: 12/02/2020 CLINICAL DATA:  Neck mass, initial workup. Additional history provided by scanning technologist: Patient reports throat swelling under tongue on left side yesterday, unable to eat secondary to pain, unable to drink, pain with swallowing. EXAM: CT NECK WITHOUT CONTRAST TECHNIQUE: Multidetector CT imaging of the neck was performed following the standard protocol without intravenous contrast. COMPARISON:  Neck CT 03/24/2016. carotid artery duplex 03/19/2016. FINDINGS: Pharynx and larynx: Streak artifact from dental restoration partially obscures the oral cavity. 3 mm calcific focus within the anterior floor of mouth on the left, compatible with a sialolith within the distal aspect of Wharton's duct. Surrounding inflammatory stranding within the left floor of mouth. Postinflammatory calcifications within the palatine tonsils bilaterally. The nasopharynx and larynx are unremarkable. Salivary glands: Enlarged left submandibular gland. Surrounding inflammatory stranding with extends caudally within the neck. The  bilateral parotid and right submandibular glands are unremarkable. No sialolith is appreciated within the left submandibular gland itself. Thyroid: Diminutive but otherwise unremarkable. Lymph nodes: No pathologically enlarged cervical chain lymph nodes identified. Vascular: Calcified plaque within the visualized aortic arch and proximal major branch vessels of the neck. Moderate calcified plaque within the carotid bifurcations and proximal ICAs. Limited intracranial: No acute intracranial abnormality identified. Visualized orbits: Incompletely imaged. No visible mass or acute finding. Mastoids and visualized paranasal sinuses: Mild ethmoid sinus mucosal thickening. Prominent soft tissue within the left nasal passage which may reflect mucosal thickening, but is nonspecific. Skeleton: No acute bony abnormality or aggressive osseous lesion. Remote C7 inferior endplate compression deformity. Upper chest: No consolidation within the imaged lung apices. Impression #1 will be called to the ordering clinician or representative by the Radiologist Assistant, and communication documented in the PACS or [employer]. IMPRESSION: Findings compatible with acute left submandibular gland sialoadenitis. 3 mm sialolith within the distal aspect of left Wharton's duct within the anterior floor of mouth. Associated inflammatory stranding within the floor of mouth, surrounding the left submandibular gland and extending caudally within the left neck suspicious for secondary floor of mouth infection and cellulitis. Aortic Atherosclerosis (ICD10-I70.0). Moderate calcified plaque within the carotid bifurcations and proximal ICAs. Mild ethmoid sinus mucosal thickening. Prominent soft tissue within the left nasal passage, which may reflect mucosal thickening but is nonspecific. Direct visualization is recommended. Electronically Signed   By: Jackey Loge DO   On: 12/02/2020 16:42     ____________________________________________   PROCEDURES   Procedure(s) performed (including Critical Care):  .Critical Care Performed by: Loleta Rose, MD Authorized by: Loleta Rose, MD   Critical care provider statement:    Critical care time (minutes):  30   Critical care time was exclusive of:  Separately billable procedures and treating other patients   Critical care was necessary to treat or prevent imminent or life-threatening deterioration of the following conditions:  Sepsis   Critical care was time spent personally by me on the following activities:  Development of treatment plan with patient or surrogate, discussions with consultants, evaluation of patient's response to treatment, examination of patient, obtaining history from patient or surrogate, ordering and performing treatments and interventions, ordering and review of laboratory studies, ordering and review of radiographic studies, pulse oximetry, re-evaluation of patient's condition and review of old charts     ____________________________________________   INITIAL IMPRESSION / MDM / ASSESSMENT AND PLAN / ED COURSE  As part of my medical decision making, I reviewed the following data within the electronic MEDICAL RECORD NUMBER Nursing notes reviewed and incorporated, Labs reviewed ,  Old chart reviewed, Discussed with admitting physician (Dr. Arville CareMansy), Discussed with ENT (Dr. Andee PolesVaught) and reviewed Notes from prior ED visits   Differential diagnosis includes, but is not limited to, sialoadenitis, Ludwig's angina, airway impingement, odontogenic infection.  Vital signs are notable for tachycardia but the patient has an appropriate blood pressure.  She is afebrile.  Comprehensive metabolic panel is normal.  She has a leukocytosis but a normal lactic acid.  Blood cultures were obtained but it is my understanding that they were obtained in the emergency department; the urgent care gave her a dose of ceftriaxone before she  came to the ED which was appropriate but may affect the blood culture results.  I ordered Decadron 10 mg IV to help with the swelling and inflammation.  I called and spoke by phone with Dr. Andee PolesVaught with ENT to confirm the plan and he agreed.  He also agreed with my question about expanding antibiotic coverage to include staph/MRSA with vancomycin per pharmacy consult.  The patient meets sepsis criteria based on heart rate, leukocytosis, and a source, but she is not severe sepsis nor septic shock.  I ordered 2 L of lactated Ringer's given the sepsis status as well as her substantially decreased oral intake over the last 2 days, to be followed by an LR infusion when the boluses are done.  I will consult hospitalist for admission.  The patient understands and agrees with the plan.         Clinical Course as of Dec 04 115  Mon Dec 03, 2020  0115 I discussed the case by phone with Dr. Arville CareMansy with the hospitalist service and he will admit.   [CF]    Clinical Course User Index [CF] Loleta RoseForbach, , MD     ____________________________________________  FINAL CLINICAL IMPRESSION(S) / ED DIAGNOSES  Final diagnoses:  Sepsis, due to unspecified organism, unspecified whether acute organ dysfunction present (HCC)  Acute sialoadenitis     MEDICATIONS GIVEN DURING THIS VISIT:  Medications  lactated ringers infusion (has no administration in time range)  lactated ringers bolus 1,000 mL (has no administration in time range)  morphine 4 MG/ML injection 4 mg (has no administration in time range)  ondansetron (ZOFRAN) injection 4 mg (has no administration in time range)  vancomycin (VANCOREADY) IVPB 1250 mg/250 mL (has no administration in time range)  fentaNYL (SUBLIMAZE) injection 50 mcg (50 mcg Intravenous Given 12/03/20 0111)  dexamethasone (DECADRON) injection 10 mg (10 mg Intravenous Given 12/03/20 0111)  lactated ringers bolus 1,000 mL (1,000 mLs Intravenous New Bag/Given 12/03/20 0111)      ED Discharge Orders    None      *Please note:  Mindy Howe was evaluated in Emergency Department on 12/03/2020 for the symptoms described in the history of present illness. She was evaluated in the context of the global COVID-19 pandemic, which necessitated consideration that the patient might be at risk for infection with the SARS-CoV-2 virus that causes COVID-19. Institutional protocols and algorithms that pertain to the evaluation of patients at risk for COVID-19 are in a state of rapid change based on information released by regulatory bodies including the CDC and federal and state organizations. These policies and algorithms were followed during the patient's care in the ED.  Some ED evaluations and interventions may be delayed as a result of limited staffing during and after the pandemic.*  Note:  This document was prepared using Dragon voice recognition software and may include unintentional dictation errors.   Loleta RoseForbach, , MD  12/03/20 0116  

## 2020-12-03 NOTE — Progress Notes (Signed)
Elink following Sepsis Protocol, L/A of 0.8 was drawn yesterday at 19:40 with BC at the same time, MAR reveals  Rocephin 1 g given IM yesterday at 17:11, will continue to monitor, no events of hypotension noted.

## 2020-12-03 NOTE — ED Notes (Signed)
Pt ambulated to restroom independently, demonstrating a steady gait, without complication.  

## 2020-12-03 NOTE — Consult Note (Signed)
Mindy Howe, Mindy Howe [id number] Aug 25, 1946 Charise Killian, MD  Reason for Consult: Sialoadenitis  HPI: 74 y.o. female admitted for acute onset of left neck and floor of mouth swelling.  Patient reports for 4 to 5 days her left neck would swell slightly with eating but then return back to normal.  2 days ago she reported acute onset of left neck swelling that has progressed to inflammation in floor of mouth.  She reports dysphagia and odynophagia but no respiratory issues.  She denies any previous history of neck swelling for salivary stones.  CT scan was obtained at [facility] and I reviewed it with Dr. Adriana Simas and given findings and patient's symptoms recommended admission for IV antibiotics and steroids.  Allergies:  Allergies  Allergen Reactions  . Solifenacin     Other reaction(s): Headache    ROS: Review of systems normal other than 12 systems except per HPI.  PMH:  Past Medical History:  Diagnosis Date  . Glaucoma   . Hyperlipidemia   . Osteoporosis     FH:  Family History  Problem Relation Age of Onset  . Diverticulitis Mother   . Heart attack Father 70  . Stroke Sister 75  . Heart attack Brother 80    SH:  Social History   Socioeconomic History  . Marital status: Widowed    Spouse name: Not on file  . Number of children: Not on file  . Years of education: Not on file  . Highest education level: Not on file  Occupational History  . Not on file  Tobacco Use  . Smoking status: Former Smoker    Packs/day: 1.00    Years: 6.00    Pack years: 6.00    Types: Cigarettes    Quit date: 09/20/1976    Years since quitting: 44.2  . Smokeless tobacco: Never Used  Vaping Use  . Vaping Use: Never used  Substance and Sexual Activity  . Alcohol use: Not Currently    Alcohol/week: 0.0 standard drinks    Comment: occasionally- quit 03/18/20  . Drug use: No  . Sexual activity: Not on file  Other Topics Concern  . Not on file  Social History Narrative  . Not on  file   Social Determinants of Health   Financial Resource Strain:   . Difficulty of Paying Living Expenses: Not on file  Food Insecurity:   . Worried About Programme researcher, broadcasting/film/video in the Last Year: Not on file  . Ran Out of Food in the Last Year: Not on file  Transportation Needs:   . Lack of Transportation (Medical): Not on file  . Lack of Transportation (Non-Medical): Not on file  Physical Activity:   . Days of Exercise per Week: Not on file  . Minutes of Exercise per Session: Not on file  Stress:   . Feeling of Stress : Not on file  Social Connections:   . Frequency of Communication with Friends and Family: Not on file  . Frequency of Social Gatherings with Friends and Family: Not on file  . Attends Religious Services: Not on file  . Active Member of Clubs or Organizations: Not on file  . Attends Banker Meetings: Not on file  . Marital Status: Not on file  Intimate Partner Violence:   . Fear of Current or Ex-Partner: Not on file  . Emotionally Abused: Not on file  . Physically Abused: Not on file  . Sexually Abused: Not on file    PSH:  Past Surgical History:  Procedure Laterality Date  . CHOLECYSTECTOMY    . ROTATOR CUFF REPAIR Right   . TUBAL LIGATION    . UMBILICAL HERNIA REPAIR      Physical  Exam:  GEN-  NAD, supine in bed EARS- external ears clear NOSE- clear anteriorly OC/OP-  Bilateral floor of mouth edema with tenderness L > R, soft edema and erythema NECK- left submandibular swelling and tenderness extending to submental area, no fluctuance felt Resp- unlabored CARD-  RRR  CT- left submandibular duct sialolith, left submandibular and floor of mouth inflammation and fat stranding consistent with sialoadenitis with cellulitis  A/P: Sialolithiasis, sialoadenitis, and floor of mouth cellulitis  Plan:  Continue IV antibiotics and scheduled steroids.  Anticipate improvement over next 24 hour and goal is for possible discharge tomorrow if continues  to improve.  Recommend fluid hydration, submandibular massage, sialogogues.  Will re-evaluate later today.   Roney Mans  12/03/2020 8:13 AM   2

## 2020-12-04 DIAGNOSIS — K1121 Acute sialoadenitis: Secondary | ICD-10-CM

## 2020-12-04 DIAGNOSIS — D72829 Elevated white blood cell count, unspecified: Secondary | ICD-10-CM

## 2020-12-04 DIAGNOSIS — A419 Sepsis, unspecified organism: Secondary | ICD-10-CM | POA: Diagnosis not present

## 2020-12-04 DIAGNOSIS — D72828 Other elevated white blood cell count: Secondary | ICD-10-CM | POA: Diagnosis not present

## 2020-12-04 LAB — CBC
HCT: 36.9 % (ref 36.0–46.0)
Hemoglobin: 12.7 g/dL (ref 12.0–15.0)
MCH: 32.2 pg (ref 26.0–34.0)
MCHC: 34.4 g/dL (ref 30.0–36.0)
MCV: 93.7 fL (ref 80.0–100.0)
Platelets: 152 10*3/uL (ref 150–400)
RBC: 3.94 MIL/uL (ref 3.87–5.11)
RDW: 12.9 % (ref 11.5–15.5)
WBC: 14.1 10*3/uL — ABNORMAL HIGH (ref 4.0–10.5)
nRBC: 0 % (ref 0.0–0.2)

## 2020-12-04 LAB — BASIC METABOLIC PANEL
Anion gap: 7 (ref 5–15)
BUN: 15 mg/dL (ref 8–23)
CO2: 25 mmol/L (ref 22–32)
Calcium: 8.9 mg/dL (ref 8.9–10.3)
Chloride: 109 mmol/L (ref 98–111)
Creatinine, Ser: 0.59 mg/dL (ref 0.44–1.00)
GFR, Estimated: >60 mL/min (ref >60)
Glucose, Bld: 139 mg/dL — ABNORMAL HIGH (ref 70–99)
Potassium: 4.4 mmol/L (ref 3.5–5.1)
Sodium: 141 mmol/L (ref 135–145)

## 2020-12-04 LAB — CORTISOL-AM, BLOOD: Cortisol - AM: 1 ug/dL — ABNORMAL LOW (ref 6.7–22.6)

## 2020-12-04 MED ORDER — AMOXICILLIN-POT CLAVULANATE 875-125 MG PO TABS: 1.0000 | ORAL_TABLET | Freq: Two times a day (BID) | ORAL | 0 refills | Status: AC

## 2020-12-04 MED ORDER — DEXAMETHASONE 2 MG PO TABS: ORAL_TABLET | ORAL | 0 refills | Status: DC

## 2020-12-04 NOTE — Care Management CC44 (Deleted)
Condition Code 44 Documentation Completed  Patient Details  Name: Mindy Howe MRN: [id number] Date of Birth: 09-21-1946   Condition Code 44 given:  Yes Patient signature on Condition Code 44 notice:    Documentation of 2 MD's agreement:  Yes Code 44 added to claim:  Yes    Allayne Butcher, RN 12/04/2020, 11:21 AM

## 2020-12-04 NOTE — Care Management Obs Status (Deleted)
MEDICARE OBSERVATION STATUS NOTIFICATION   Patient Details  Name: Mindy Howe MRN: [id number] Date of Birth: June 14, 1946   Medicare Observation Status Notification Given:  Yes    Allayne Butcher, RN 12/04/2020, 11:21 AM

## 2020-12-04 NOTE — TOC Progression Note (Signed)
Transition of Care [facility]) - Progression Note    Patient Details  Name: Mindy Howe MRN: [id number] Date of Birth: [birth year]  Transition of Care [facility]) CM/SW Contact  Allayne Butcher, RN Phone Number: 12/04/2020, 11:58 AM  Clinical Narrative:    Code 44 given in error.  RNCM instructed patient to disregard.         Expected Discharge Plan and Services           Expected Discharge Date: 12/04/20                                     Social Determinants of Health (SDOH) Interventions    Readmission Risk Interventions No flowsheet data found.

## 2020-12-04 NOTE — Progress Notes (Signed)
..   12/04/2020 1:24 PM  Mindy Howe [id number]   Temp:  [97.8 F (36.6 C)-98.8 F (37.1 C)] 98.4 F (36.9 C) (12/07 0731) Pulse Rate:  [85-101] 91 (12/07 0731) Resp:  [14-18] 17 (12/07 0731) BP: (86-126)/(42-74) 126/73 (12/07 0731) SpO2:  [96 %-99 %] 99 % (12/07 0731),     Intake/Output Summary (Last 24 hours) at 12/04/2020 1324 Last data filed at 12/04/2020 0643 Gross per 24 hour  Intake 535.36 ml  Output --  Net 535.36 ml    Results for orders placed or performed during the hospital encounter of 12/03/20 (from the past 24 hour(s))  Cortisol-am, blood     Status: Abnormal   Collection Time: 12/04/20  5:42 AM  Result Value Ref Range   Cortisol - AM 1.0 (L) 6.7 - 22.6 ug/dL  CBC     Status: Abnormal   Collection Time: 12/04/20  5:42 AM  Result Value Ref Range   WBC 14.1 (H) 4.0 - 10.5 K/uL   RBC 3.94 3.87 - 5.11 MIL/uL   Hemoglobin 12.7 12.0 - 15.0 g/dL   HCT 63.1 36 - 46 %   MCV 93.7 80.0 - 100.0 fL   MCH 32.2 26.0 - 34.0 pg   MCHC 34.4 30.0 - 36.0 g/dL   RDW 49.7 02.6 - 37.8 %   Platelets 152 150 - 400 K/uL   nRBC 0.0 0.0 - 0.2 %  Basic metabolic panel     Status: Abnormal   Collection Time: 12/04/20  5:42 AM  Result Value Ref Range   Sodium 141 135 - 145 mmol/L   Potassium 4.4 3.5 - 5.1 mmol/L   Chloride 109 98 - 111 mmol/L   CO2 25 22 - 32 mmol/L   Glucose, Bld 139 (H) 70 - 99 mg/dL   BUN 15 8 - 23 mg/dL   Creatinine, Ser 5.88 0.44 - 1.00 mg/dL   Calcium 8.9 8.9 - 50.2 mg/dL   GFR, Estimated >77 >41 mL/min   Anion gap 7 5 - 15    SUBJECTIVE:  Significant improvement this morning.  Passed stone and has it on paper towel.  Decreased pain and swelling.  Wants to go home and is hungry.  OBJECTIVE:  GEN- NAD OC/OP-  Purulence from duct on left, improved edema and induration NECK- continued left submandibular gland edema but improved tenderness  IMPRESSION:  Sialoadenitis improving  PLAN:  Follow up with me in 1 week.  Recommend oral antibiotics and  steroid taper.    12/04/2020, 1:24 PM

## 2020-12-04 NOTE — Discharge Summary (Signed)
Physician Discharge Summary  Mindy Howe RUE:[id number] DOB: 1946-04-09 DOA: 12/03/2020  PCP: Rayetta Humphrey, MD  Admit date: 12/03/2020 Discharge date: 12/04/2020  Admitted From: home Disposition:  home  Recommendations for Outpatient Follow-up:  Follow up with PCP in 1 week  Home Health: no  Equipment/Devices:  Discharge Condition: stable  CODE STATUS: full  Diet recommendation: Heart Healthy  Brief/Interim Summary: HPI was taken from Dr. Arville Care: Mindy Howe  is a 74 y.o. Caucasian female with a known history of dyslipidemia, glaucoma and osteoporosis, presented to the emergency room with acute onset of left neck pain and swelling for which she presented to urgent care.  This has been worsening recently over the last 4-5 days and left neck pain has been currently severe and the swelling below her jaw has been giving her difficulty with eating and drinking.  She has been having low-grade fever without chills.  No tooth ache.  No neck stiffness.  She denies any nausea or vomiting abdominal pain or diarrhea.  No dyspnea or cough or wheezing.  No tongue or bladder swelling.  She has been having difficulty swallowing and pain though when he opens his mouth.  The patient initially went to urgent care and was evaluated by Dr. Adriana Simas who obtained the CT of the neck with the below findings.  He then talked over the phone with Dr. Andee Poles with ENT who recommended ER presentation for admission.  Upon presentation to the ER, blood pressure was 173/106 with otherwise normal vital signs.  Labs revealed unremarkable CMP except for total protein of 8.9 and albumin of 4.9 with lactic acid 0.8.  CBC showed leukocytosis 13.6 with neutrophilia as well as hemoconcentration.  Influenza antigens RSV and COVID-19 PCR came back negative.  Blood cultures were drawn.  CT soft tissue neck revealed acute left submandibular sialoadenitis with 3 mm sialolith within the distal aspect of left Wharton's duct within  the anterior floor of the mouth with inflammatory stranding within the floor of mouth surrounding the left submandibular gland and extending caudally within the left neck suspicious for secondary floor of mouth infection and cellulitis.  It showed aortic atherosclerosis and moderate calcified plaque in the carotid bifurcations of proximal ICAs as well as mild ethmoid sinus mucosal thickening and prominent soft tissue within the left nasal passage that may reflect nonspecific mucosal thickening.  The patient was given 10 mg of IV Decadron, 50 mcg of IV fentanyl, 1 L bolus of IV normal saline twice, 4 mg of IV morphine sulfate, 4 mg of IV Zofran and 1250 mg of IV vancomycin.  She will be admitted to a medical bed for further evaluation and management.  Hospital Course from Dr. Mayford Knife 12/6-12/7/21: Pt presented w/ left submandibular gland sialoadenitis and floor of mouth cellulitis. Pt was placed on IV vanco, IV cefepime & IV steroids. ENT saw the pt and recommended to continue w/ above/below stated treatment. Pt was d/c home w/ po augmentin and steroid taper as per ENT. For more information, please see previous progress/consult notes.   Discharge Diagnoses:  Active Problems:   Sepsis (HCC)  Left submandibular gland sialoadenitis: with associated severe cellulitis of the floor the mouth and subsequent sepsis. Meets critieria w/ leukocytosis, tachycardia.  Continue on IV vanco & cefepime while inpatient only. ENT following and recs apprec. Blood cxs NGTD. Continue on IV decadron while inpatient only . Continue on IVFs.   Leukocytosis: likely secondary to above infection. Continue on IV abxs  HLD: continue on home dose  of ezetimibe/simvastatin  Overactive bladder: will hold home dose of vesicare   Osteoporosis: will hold home dose of risedronate  Discharge Instructions  Discharge Instructions    Diet - low sodium heart healthy   Complete by: As directed    Discharge instructions   Complete  by: As directed    F/u w/ PCP in 1 week   Increase activity slowly   Complete by: As directed      Allergies as of 12/04/2020      Reactions   Solifenacin    Other reaction(s): Headache      Medication List    STOP taking these medications   solifenacin 5 MG tablet Commonly known as: VESICARE     TAKE these medications   amoxicillin-clavulanate 875-125 MG tablet Commonly known as: Augmentin Take 1 tablet by mouth 2 (two) times daily for 7 days.   cholecalciferol 1000 units tablet Commonly known as: VITAMIN D Take 1,000 Units by mouth daily.   dexamethasone 2 MG tablet Commonly known as: DECADRON  daily x 1 day, 6 mg daily x 2 days,  daily x 2 days,  daily x 2 days then stop   ezetimibe-simvastatin 10-20 MG tablet Commonly known as: VYTORIN Take 1 tablet by mouth at bedtime.   latanoprost 0.005 % ophthalmic solution Commonly known as: XALATAN Place 1 drop into both eyes at bedtime.   naltrexone 50 MG tablet Commonly known as: DEPADE Take 50 mg by mouth daily at 12 noon.   risedronate 35 MG tablet Commonly known as: ACTONEL Take 35 mg by mouth every 7 (seven) days. with water on empty stomach, nothing by mouth or lie down for next 30 minutes.       Allergies  Allergen Reactions  . Solifenacin     Other reaction(s): Headache    Consultations:  ENT, Dr. Andee Poles    Procedures/Studies: CT Soft Tissue Neck Wo Contrast  Result Date: 12/02/2020 CLINICAL DATA:  Neck mass, initial workup. Additional history provided by scanning technologist: Patient reports throat swelling under tongue on left side yesterday, unable to eat secondary to pain, unable to drink, pain with swallowing. EXAM: CT NECK WITHOUT CONTRAST TECHNIQUE: Multidetector CT imaging of the neck was performed following the standard protocol without intravenous contrast. COMPARISON:  Neck CT 03/24/2016. carotid artery duplex 03/19/2016. FINDINGS: Pharynx and larynx: Streak artifact from dental  restoration partially obscures the oral cavity. 3 mm calcific focus within the anterior floor of mouth on the left, compatible with a sialolith within the distal aspect of Wharton's duct. Surrounding inflammatory stranding within the left floor of mouth. Postinflammatory calcifications within the palatine tonsils bilaterally. The nasopharynx and larynx are unremarkable. Salivary glands: Enlarged left submandibular gland. Surrounding inflammatory stranding with extends caudally within the neck. The bilateral parotid and right submandibular glands are unremarkable. No sialolith is appreciated within the left submandibular gland itself. Thyroid: Diminutive but otherwise unremarkable. Lymph nodes: No pathologically enlarged cervical chain lymph nodes identified. Vascular: Calcified plaque within the visualized aortic arch and proximal major branch vessels of the neck. Moderate calcified plaque within the carotid bifurcations and proximal ICAs. Limited intracranial: No acute intracranial abnormality identified. Visualized orbits: Incompletely imaged. No visible mass or acute finding. Mastoids and visualized paranasal sinuses: Mild ethmoid sinus mucosal thickening. Prominent soft tissue within the left nasal passage which may reflect mucosal thickening, but is nonspecific. Skeleton: No acute bony abnormality or aggressive osseous lesion. Remote C7 inferior endplate compression deformity. Upper chest: No consolidation within the imaged lung apices. Impression #  1 will be called to the ordering clinician or representative by the Radiologist Assistant, and communication documented in the PACS or [employer]. IMPRESSION: Findings compatible with acute left submandibular gland sialoadenitis. 3 mm sialolith within the distal aspect of left Wharton's duct within the anterior floor of mouth. Associated inflammatory stranding within the floor of mouth, surrounding the left submandibular gland and extending caudally within the  left neck suspicious for secondary floor of mouth infection and cellulitis. Aortic Atherosclerosis (ICD10-I70.0). Moderate calcified plaque within the carotid bifurcations and proximal ICAs. Mild ethmoid sinus mucosal thickening. Prominent soft tissue within the left nasal passage, which may reflect mucosal thickening but is nonspecific. Direct visualization is recommended. Electronically Signed   By: Jackey Loge DO   On: 12/02/2020 16:42       Subjective: pt c/o fatigue    Discharge Exam: Vitals:   12/04/20 0456 12/04/20 0731  BP: 117/74 126/73  Pulse: 85 91  Resp: 17 17  Temp: 98.8 F (37.1 C) 98.4 F (36.9 C)  SpO2: 96% 99%   Vitals:   12/03/20 1936 12/03/20 2340 12/04/20 0456 12/04/20 0731  BP: 124/73 (!) 86/42 117/74 126/73  Pulse: 92 94 85 91  Resp: 16 18 17 17   Temp: 97.8 F (36.6 C) 98.3 F (36.8 C) 98.8 F (37.1 C) 98.4 F (36.9 C)  TempSrc:  Oral  Oral  SpO2: 97% 96% 96% 99%  Weight:      Height:        General: Pt is alert, awake, not in acute distress Cardiovascular: S1/S2 +, no rubs, no gallops Respiratory: CTA bilaterally, no wheezing, no rhonchi Abdominal: Soft, NT, ND, bowel sounds + Extremities: no edema, no cyanosis    The results of significant diagnostics from this hospitalization (including imaging, microbiology, ancillary and laboratory) are listed below for reference.     Microbiology: Recent Results (from the past 240 hour(s))  Culture, blood (Routine x 2)     Status: None (Preliminary result)   Collection Time: 12/02/20  7:40 PM   Specimen: BLOOD  Result Value Ref Range Status   Specimen Description BLOOD RIGHT ANTECUBITAL  Final   Special Requests   Final    BOTTLES DRAWN AEROBIC AND ANAEROBIC Blood Culture results may not be optimal due to an excessive volume of blood received in culture bottles   Culture   Final    NO GROWTH 2 DAYS Performed at [facility], [address] [place], [place]    Report Status  PENDING  Incomplete  Culture, blood (Routine x 2)     Status: None (Preliminary result)   Collection Time: 12/02/20 11:10 PM   Specimen: BLOOD  Result Value Ref Range Status   Specimen Description BLOOD LEFT ANTECUBITAL  Final   Special Requests   Final    BOTTLES DRAWN AEROBIC AND ANAEROBIC Blood Culture results may not be optimal due to an excessive volume of blood received in culture bottles   Culture   Final    NO GROWTH 2 DAYS Performed at [facility], [address], [place], [place]    Report Status PENDING  Incomplete  Resp Panel by RT-PCR (Flu A&B, Covid) Nasopharyngeal Swab     Status: None   Collection Time: 12/02/20 11:10 PM   Specimen: Nasopharyngeal Swab; Nasopharyngeal(NP) swabs in vial transport medium  Result Value Ref Range Status   SARS Coronavirus 2 by RT PCR NEGATIVE NEGATIVE Final    Comment: (NOTE) SARS-CoV-2 target nucleic acids are  NOT DETECTED.  The SARS-CoV-2 RNA is generally detectable in upper respiratory specimens during the acute phase of infection. The lowest concentration of SARS-CoV-2 viral copies this assay can detect is 138 copies/mL. A negative result does not preclude SARS-Cov-2 infection and should not be used as the sole basis for treatment or other patient management decisions. A negative result may occur with  improper specimen collection/handling, submission of specimen other than nasopharyngeal swab, presence of viral mutation(s) within the areas targeted by this assay, and inadequate number of viral copies(<138 copies/mL). A negative result must be combined with clinical observations, patient history, and epidemiological information. The expected result is Negative.  Fact Sheet for Patients:  BloggerCourse.comhttps://www.fda.gov/media/152166/download  Fact Sheet for Healthcare Providers:  SeriousBroker.ithttps://www.fda.gov/media/152162/download  This test is no t yet approved or cleared by the [place] FDA and  has been authorized for  detection and/or diagnosis of SARS-CoV-2 by FDA under an Emergency Use Authorization (EUA). This EUA will remain  in effect (meaning this test can be used) for the duration of the COVID-19 declaration under Section 564(b)(1) of the Act, 21 U.S.C.section 360bbb-3(b)(1), unless the authorization is terminated  or revoked sooner.       Influenza A by PCR NEGATIVE NEGATIVE Final   Influenza B by PCR NEGATIVE NEGATIVE Final    Comment: (NOTE) The Xpert Xpress SARS-CoV-2/FLU/RSV plus assay is intended as an aid in the diagnosis of influenza from Nasopharyngeal swab specimens and should not be used as a sole basis for treatment. Nasal washings and aspirates are unacceptable for Xpert Xpress SARS-CoV-2/FLU/RSV testing.  Fact Sheet for Patients: BloggerCourse.comhttps://www.fda.gov/media/152166/download  Fact Sheet for Healthcare Providers: SeriousBroker.ithttps://www.fda.gov/media/152162/download  This test is not yet approved or cleared by the [place] FDA and has been authorized for detection and/or diagnosis of SARS-CoV-2 by FDA under an Emergency Use Authorization (EUA). This EUA will remain in effect (meaning this test can be used) for the duration of the COVID-19 declaration under Section 564(b)(1) of the Act, 21 U.S.C. section 360bbb-3(b)(1), unless the authorization is terminated or revoked.  Performed at [facility], [address] [place], [place] [id number]      Labs: BNP (last 3 results) No results for input(s): BNP in the last 8760 hours. Basic Metabolic Panel: Recent Labs  Lab 12/02/20 1940 12/03/20 0328 12/04/20 0542  NA 141 136 141  K 3.8 4.0 4.4  CL 104 105 109  CO2 25 22 25   GLUCOSE 121* 141* 139*  BUN 10 10 15   CREATININE 0.58 0.51 0.59  CALCIUM 9.5 8.6* 8.9   Liver Function Tests: Recent Labs  Lab 12/02/20 1940  AST 29  ALT 22  ALKPHOS 90  BILITOT 1.0  PROT 8.9*  ALBUMIN 4.9   No results for input(s): LIPASE, AMYLASE in the last 168 hours. No results for  input(s): AMMONIA in the last 168 hours. CBC: Recent Labs  Lab 12/02/20 1940 12/03/20 0328 12/04/20 0542  WBC 13.6* 14.6* 14.1*  NEUTROABS 11.6*  --   --   HGB 15.6* 13.5 12.7  HCT 45.7 40.9 36.9  MCV 93.5 94.7 93.7  PLT 170 158 152   Cardiac Enzymes: No results for input(s): CKTOTAL, CKMB, CKMBINDEX, TROPONINI in the last 168 hours. BNP: Invalid input(s): POCBNP CBG: No results for input(s): GLUCAP in the last 168 hours. D-Dimer No results for input(s): DDIMER in the last 72 hours. Hgb A1c No results for input(s): HGBA1C in the last 72 hours. Lipid Profile No results for input(s): CHOL, HDL, LDLCALC, TRIG, CHOLHDL, LDLDIRECT  in the last 72 hours. Thyroid function studies No results for input(s): TSH, T4TOTAL, T3FREE, THYROIDAB in the last 72 hours.  Invalid input(s): FREET3 Anemia work up No results for input(s): VITAMINB12, FOLATE, FERRITIN, TIBC, IRON, RETICCTPCT in the last 72 hours. Urinalysis    Component Value Date/Time   COLORURINE YELLOW 10/30/2016 1521   APPEARANCEUR CLEAR 10/30/2016 1521   LABSPEC 1.015 10/30/2016 1521   PHURINE 7.0 10/30/2016 1521   GLUCOSEU NEGATIVE 10/30/2016 1521   HGBUR NEGATIVE 10/30/2016 1521   BILIRUBINUR NEGATIVE 10/30/2016 1521   KETONESUR NEGATIVE 10/30/2016 1521   PROTEINUR NEGATIVE 10/30/2016 1521   NITRITE NEGATIVE 10/30/2016 1521   LEUKOCYTESUR NEGATIVE 10/30/2016 1521   Sepsis Labs Invalid input(s): PROCALCITONIN,  WBC,  LACTICIDVEN Microbiology Recent Results (from the past 240 hour(s))  Culture, blood (Routine x 2)     Status: None (Preliminary result)   Collection Time: 12/02/20  7:40 PM   Specimen: BLOOD  Result Value Ref Range Status   Specimen Description BLOOD RIGHT ANTECUBITAL  Final   Special Requests   Final    BOTTLES DRAWN AEROBIC AND ANAEROBIC Blood Culture results may not be optimal due to an excessive volume of blood received in culture bottles   Culture   Final    NO GROWTH 2 DAYS Performed at  [facility], [address], [place], [place] [address]    Report Status PENDING  Incomplete  Culture, blood (Routine x 2)     Status: None (Preliminary result)   Collection Time: 12/02/20 11:10 PM   Specimen: BLOOD  Result Value Ref Range Status   Specimen Description BLOOD LEFT ANTECUBITAL  Final   Special Requests   Final    BOTTLES DRAWN AEROBIC AND ANAEROBIC Blood Culture results may not be optimal due to an excessive volume of blood received in culture bottles   Culture   Final    NO GROWTH 2 DAYS Performed at [facility], [address] [place], [place] [address]    Report Status PENDING  Incomplete  Resp Panel by RT-PCR (Flu A&B, Covid) Nasopharyngeal Swab     Status: None   Collection Time: 12/02/20 11:10 PM   Specimen: Nasopharyngeal Swab; Nasopharyngeal(NP) swabs in vial transport medium  Result Value Ref Range Status   SARS Coronavirus 2 by RT PCR NEGATIVE NEGATIVE Final    Comment: (NOTE) SARS-CoV-2 target nucleic acids are NOT DETECTED.  The SARS-CoV-2 RNA is generally detectable in upper respiratory specimens during the acute phase of infection. The lowest concentration of SARS-CoV-2 viral copies this assay can detect is 138 copies/mL. A negative result does not preclude SARS-Cov-2 infection and should not be used as the sole basis for treatment or other patient management decisions. A negative result may occur with  improper specimen collection/handling, submission of specimen other than nasopharyngeal swab, presence of viral mutation(s) within the areas targeted by this assay, and inadequate number of viral copies(<138 copies/mL). A negative result must be combined with clinical observations, patient history, and epidemiological information. The expected result is Negative.  Fact Sheet for Patients:  BloggerCourse.com  Fact Sheet for Healthcare Providers:  SeriousBroker.it  This test is  no t yet approved or cleared by the [place] FDA and  has been authorized for detection and/or diagnosis of SARS-CoV-2 by FDA under an Emergency Use Authorization (EUA). This EUA will remain  in effect (meaning this test can be used) for the duration of the COVID-19 declaration under Section 564(b)(1) of the Act, 21 U.S.C.section 360bbb-3(b)(1),  unless the authorization is terminated  or revoked sooner.       Influenza A by PCR NEGATIVE NEGATIVE Final   Influenza B by PCR NEGATIVE NEGATIVE Final    Comment: (NOTE) The Xpert Xpress SARS-CoV-2/FLU/RSV plus assay is intended as an aid in the diagnosis of influenza from Nasopharyngeal swab specimens and should not be used as a sole basis for treatment. Nasal washings and aspirates are unacceptable for Xpert Xpress SARS-CoV-2/FLU/RSV testing.  Fact Sheet for Patients: BloggerCourse.com  Fact Sheet for Healthcare Providers: SeriousBroker.it  This test is not yet approved or cleared by the [place] FDA and has been authorized for detection and/or diagnosis of SARS-CoV-2 by FDA under an Emergency Use Authorization (EUA). This EUA will remain in effect (meaning this test can be used) for the duration of the COVID-19 declaration under Section 564(b)(1) of the Act, 21 U.S.C. section 360bbb-3(b)(1), unless the authorization is terminated or revoked.  Performed at [facility], [address], [place], [place] [address]      Time coordinating discharge: Over 30 minutes  SIGNED:   Charise Killian, MD  [facility] 12/04/2020, 11:11 AM Pager   If 7PM-7AM, please contact night-coverage

## 2020-12-07 LAB — CULTURE, BLOOD (ROUTINE X 2)
Culture: NO GROWTH
Culture: NO GROWTH

## 2021-01-07 ENCOUNTER — Other Ambulatory Visit: Payer: Self-pay

## 2021-01-07 ENCOUNTER — Ambulatory Visit
Admission: EM | Admit: 2021-01-07 | Discharge: 2021-01-07 | Disposition: A | Discharge status: Home / Self Care | Payer: Medicare Other | Attending: Family Medicine | Admitting: Family Medicine

## 2021-01-07 DIAGNOSIS — Z20822 Contact with and (suspected) exposure to covid-19: Secondary | ICD-10-CM | POA: Insufficient documentation

## 2021-01-07 NOTE — ED Triage Notes (Signed)
Patient states that she was exposed to covid, no symptoms.

## 2021-01-08 LAB — SARS CORONAVIRUS 2 (TAT 6-24 HRS): SARS Coronavirus 2: NEGATIVE

## 2021-02-06 ENCOUNTER — Other Ambulatory Visit: Payer: Self-pay

## 2021-02-06 ENCOUNTER — Ambulatory Visit (INDEPENDENT_AMBULATORY_CARE_PROVIDER_SITE_OTHER)
Admit: 2021-02-06 | Discharge: 2021-02-06 | Disposition: A | Discharge status: Home / Self Care | Payer: Medicare Other | Attending: Family Medicine | Admitting: Family Medicine

## 2021-02-06 ENCOUNTER — Ambulatory Visit
Admission: EM | Admit: 2021-02-06 | Discharge: 2021-02-06 | Disposition: A | Discharge status: Home / Self Care | Payer: Medicare Other | Attending: Physician Assistant | Admitting: Physician Assistant

## 2021-02-06 DIAGNOSIS — S0083XA Contusion of other part of head, initial encounter: Secondary | ICD-10-CM

## 2021-02-06 DIAGNOSIS — W19XXXA Unspecified fall, initial encounter: Secondary | ICD-10-CM | POA: Diagnosis not present

## 2021-02-06 DIAGNOSIS — R519 Headache, unspecified: Secondary | ICD-10-CM

## 2021-02-06 DIAGNOSIS — S0990XA Unspecified injury of head, initial encounter: Secondary | ICD-10-CM

## 2021-02-06 DIAGNOSIS — S0993XA Unspecified injury of face, initial encounter: Secondary | ICD-10-CM | POA: Diagnosis not present

## 2021-02-06 DIAGNOSIS — R110 Nausea: Secondary | ICD-10-CM | POA: Diagnosis not present

## 2021-02-06 MED ORDER — DICLOFENAC SODIUM 50 MG PO TBEC: 50.0000 mg | DELAYED_RELEASE_TABLET | Freq: Two times a day (BID) | ORAL | 0 refills | Status: AC

## 2021-02-06 MED ORDER — ONDANSETRON HCL 4 MG PO TABS: 4.0000 mg | ORAL_TABLET | Freq: Four times a day (QID) | ORAL | 0 refills | Status: AC

## 2021-02-06 NOTE — Discharge Instructions (Addendum)
HEADACHE: You were seen in clinic today for headache. Rest and take meds as directed. If at any point, the headache becomes very severe, is associated with fever, is associated with neck pain/stiffness, you feel like passing out, the headache is different from any you've have had before, there are vision changes/issues with speech/issues with balance, or numbness/weakness in a part of the body, you should be seen urgently or emergently for more serious causes of headache   HEAD INJURY: I have also discussed care of head injury, as well as red flags for emergent treatment which would mean that he needed to go to the ER immediately. Some red flags include; increased confusion, memory loss, lethargy, vision changes, nausea and vomiting, balance difficulty, extremity weakness, numbness/tingling/burning, severe headache or neck pain. I have strongly suggested that someone monitor the patient closely for the next 24-48 hours

## 2021-02-06 NOTE — ED Triage Notes (Signed)
Pt reports falling on Monday morning after using the restroom. sts while she was sitting on the toilet she felt weak and dizzy. sts since the fall she have not felt dizzy or weak.  After getting up from the toilet she fell forward and hit face on scale in the bathroom. bruising noted to chin. sts there was some bruising to forehead but not visible during triage.  sts she have a nagging headache since the fall.  denies taking blood thinners.

## 2021-02-06 NOTE — ED Provider Notes (Signed)
[facility]    CSN: [id number] Arrival date & time: 02/06/21  1142      History   Chief Complaint Chief Complaint  Patient presents with  . Fall    HPI Mindy Howe is a 75 y.o. female presenting for dull aching frontal headache x 2 days. She says that on Monday at 4 am she was on her toilet having a bowel movement and then when she finished she got up off the toilet and felt dizzy and weak. She subsequently fell forward and hit her head on her bathroom scale. Denies LOC.  She has had bruising to her chin and forehead.  She said her chin does not let her do much and she is most concerned about her persistent headache.  She is taken ibuprofen which has helped.  Patient denies any history of headaches or migraines.  She says that she has had worse headaches than the one she currently has but this 1 is just "nagging."  She admits to some associated nausea without vomiting.  Denies any continued dizziness.  She admits to some fatigue.  Denies any vision changes, confusion, slurred speech, difficulty walking, numbness/tingling or weakness.  Patient admits to chronic neck pain which has not been exacerbated by this accident.  She does follow-up with Ortho for her chronic neck pain and head steroid injection 2 weeks ago.  Patient denies any anticoagulant use.  At the time of the fall, she denied any headaches, chest pain, palpitations, breathing difficulty, numbness or tingling.  She has no other injuries, complaints or concerns.  HPI  Past Medical History:  Diagnosis Date  . Glaucoma   . Hyperlipidemia   . Osteoporosis     Patient Active Problem List   Diagnosis Date Noted  . Acute sialoadenitis   . Leucocytosis   . Sepsis (HCC) 12/03/2020    Past Surgical History:  Procedure Laterality Date  . CHOLECYSTECTOMY    . ROTATOR CUFF REPAIR Right   . TUBAL LIGATION    . UMBILICAL HERNIA REPAIR      OB History   No obstetric history on file.      Home  Medications    Prior to Admission medications   Medication Sig Start Date End Date Taking? Authorizing Provider  diclofenac (VOLTAREN) 50 MG EC tablet Take 1 tablet (50 mg total) by mouth 2 (two) times daily for 5 days. 02/06/21 02/11/21 Yes Eusebio Friendly B, PA-C  ondansetron (ZOFRAN) 4 MG tablet Take 1 tablet (4 mg total) by mouth every 6 (six) hours for 5 days. 02/06/21 02/11/21 Yes Eusebio Friendly B, PA-C  cholecalciferol (VITAMIN D) 1000 UNITS tablet Take 1,000 Units by mouth daily.    [provider]  dexamethasone (DECADRON) 2 MG tablet 8mg  daily x 1 day, 6 mg daily x 2 days, 4mg  daily x 2 days, 2mg  daily x 2 days then stop 12/04/20   , MD  ezetimibe-simvastatin (VYTORIN) 10-20 MG per tablet Take 1 tablet by mouth at bedtime.     [provider]  latanoprost (XALATAN) 0.005 % ophthalmic solution Place 1 drop into both eyes at bedtime.     [provider]  naltrexone (DEPADE) 50 MG tablet Take 50 mg by mouth daily at 12 noon.  10/10/20   [provider]  risedronate (ACTONEL) 35 MG tablet Take 35 mg by mouth every 7 (seven) days. with water on empty stomach, nothing by mouth or lie down for next 30 minutes.  [provider]    Family History Family History  Problem Relation Age of Onset  . Diverticulitis Mother   . Heart attack Father 109  . Stroke Sister 20  . Heart attack Brother 6    Social History Social History   Tobacco Use  . Smoking status: Former Smoker    Packs/day: 1.00    Years: 6.00    Pack years: 6.00    Types: Cigarettes    Quit date: 09/20/1976    Years since quitting: 44.4  . Smokeless tobacco: Never Used  Vaping Use  . Vaping Use: Never used  Substance Use Topics  . Alcohol use: Not Currently    Alcohol/week: 0.0 standard drinks    Comment: occasionally- quit 03/18/20  . Drug use: No     Allergies   Solifenacin   Review of Systems Review of Systems  Constitutional: Positive for fatigue.  Negative for chills and diaphoresis.  HENT: Negative for ear pain and rhinorrhea.   Eyes: Negative for photophobia and visual disturbance.  Respiratory: Negative for shortness of breath.   Cardiovascular: Negative for chest pain and palpitations.  Gastrointestinal: Positive for nausea. Negative for vomiting.  Musculoskeletal: Positive for neck pain (chronic, not worse since fall). Negative for arthralgias.  Skin: Positive for color change (brusing to chin, forehead, nose).  Neurological: Positive for headaches. Negative for dizziness, syncope, facial asymmetry, speech difficulty, weakness, light-headedness and numbness.  Hematological: Does not bruise/bleed easily.  Psychiatric/Behavioral: Negative for confusion and dysphoric mood.     Physical Exam Triage Vital Signs ED Triage Vitals [02/06/21 1200]  Enc Vitals Group     BP (!) 144/76     Pulse Rate 78     Resp 18     Temp 98.2 F (36.8 C)     Temp Source Oral     SpO2 100 %     Weight 126 lb (57.2 kg)     Height 5\' 3"  (1.6 m)     Head Circumference      Peak Flow      Pain Score 5     Pain Loc      Pain Edu?      Excl. in GC?    No data found.  Updated Vital Signs BP (!) 144/76   Pulse 78   Temp 98.2 F (36.8 C) (Oral)   Resp 18   Ht 5\' 3"  (1.6 m)   Wt 126 lb (57.2 kg)   SpO2 100%   BMI 22.32 kg/m       Physical Exam Vitals and nursing note reviewed.  Constitutional:      General: She is not in acute distress.    Appearance: Normal appearance. She is not ill-appearing or toxic-appearing.  HENT:     Head: Normocephalic and atraumatic.     Right Ear: Tympanic membrane, ear canal and external ear normal.     Left Ear: Tympanic membrane, ear canal and external ear normal.     Nose: Nose normal.     Mouth/Throat:     Mouth: Mucous membranes are moist.     Pharynx: Oropharynx is clear.  Eyes:     General: No scleral icterus.       Right eye: No discharge.        Left eye: No discharge.     Extraocular  Movements: Extraocular movements intact.     Conjunctiva/sclera: Conjunctivae normal.     Pupils: Pupils are equal, round, and reactive to light.  Cardiovascular:  Rate and Rhythm: Normal rate and regular rhythm.     Pulses: Normal pulses.     Heart sounds: Normal heart sounds.  Pulmonary:     Effort: Pulmonary effort is normal. No respiratory distress.     Breath sounds: Normal breath sounds. No wheezing, rhonchi or rales.  Musculoskeletal:     Cervical back: Normal range of motion and neck supple. Tenderness (C4-C6 mildly and paracervical muscles. Patient says no change in pain level from her baseline) present. No rigidity.  Skin:    General: Skin is dry.     Findings: Bruising (ecchymosis to chin, very faint ecchymosis frontal region with slight swelling. Small contusion left nasal bridge) present.  Neurological:     General: No focal deficit present.     Mental Status: She is alert and oriented to person, place, and time. Mental status is at baseline.     Cranial Nerves: No cranial nerve deficit.     Sensory: No sensory deficit.     Motor: No weakness.     Coordination: Coordination normal.     Gait: Gait normal.     Deep Tendon Reflexes: Reflexes normal.  Psychiatric:        Mood and Affect: Mood normal.        Behavior: Behavior normal.        Thought Content: Thought content normal.        Judgment: Judgment normal.      UC Treatments / Results  Labs (all labs ordered are listed, but only abnormal results are displayed) Labs Reviewed - No data to display  EKG   Radiology CT Head Wo Contrast  Result Date: 02/06/2021 CLINICAL DATA:  Fall, facial trauma, frontal headache EXAM: CT HEAD WITHOUT CONTRAST TECHNIQUE: Contiguous axial images were obtained from the base of the skull through the vertex without intravenous contrast. COMPARISON:  None. FINDINGS: Brain: Normal anatomic configuration. Parenchymal volume loss is commensurate with the patient's age. Mild  periventricular white matter changes are present likely reflecting the sequela of small vessel ischemia. No abnormal intra or extra-axial mass lesion or fluid collection. No abnormal mass effect or midline shift. No evidence of acute intracranial hemorrhage or infarct. Ventricular size is normal. Cerebellum unremarkable. Vascular: No asymmetric hyperdense vasculature at the skull base. Skull: Intact Sinuses/Orbits: Paranasal sinuses are clear. Orbits are unremarkable. Other: Mastoid air cells and middle ear cavities are clear. Mild frontal soft tissue swelling is noted. IMPRESSION: No acute intracranial abnormality. No calvarial fracture. Mild frontal soft tissue swelling. Electronically Signed   By: Helyn Numbers MD   On: 02/06/2021 13:48    Procedures Procedures (including critical care time)  Medications Ordered in UC Medications - No data to display  Initial Impression / Assessment and Plan / UC Course  I have reviewed the triage vital signs and the nursing notes.  Pertinent labs & imaging results that were available during my care of the patient were reviewed by me and considered in my medical decision making (see chart for details).    75 y/o female presenting for 2-day history of headaches and nausea.  Patient did report dizziness and weakness after having a bowel movement immediately before.  Has had persistent mild headaches since.  Admits to nausea without vomiting.  Denies any dizziness or severe headaches.  She does have bruising of her chin, nose and forehead.  States that her headache is frontal region.  The clinical vital signs are normal and stable.  Exam significant for contusions as mentioned above.  Neurological exam is within normal limits.  CT scan of head without contrast obtained today which does not show any acute intracranial abnormality or fractures.  Discussed results with patient.  Patient's dizziness episode appears to be consistent with vasovagal presyncope.  She  did not pass out or lose consciousness.  The episode did happen when she was getting up from having a bowel movement.  She denies any continued dizziness.  Denies ever having any palpitations, chest pain or breathing difficulty.  Advised supportive care for her headache.  I have sent diclofenac to pharmacy.  Also encouraged her to try Tylenol and ice the areas of bruising.  Ice rest and increasing fluids.  I sent Zofran for nausea if needed.  Reviewed ED precautions were head injury and headaches with patient.  Advised to follow-up with her department as needed.  Advised following up with PCP in about a week for recheck or sooner if needed.  Final Clinical Impressions(s) / UC Diagnoses   Final diagnoses:  Injury of head, initial encounter  Contusion of face, initial encounter  Fall, initial encounter  Nausea without vomiting  Acute nonintractable headache, unspecified headache type     Discharge Instructions     HEADACHE: You were seen in clinic today for headache. Rest and take meds as directed. If at any point, the headache becomes very severe, is associated with fever, is associated with neck pain/stiffness, you feel like passing out, the headache is different from any you've have had before, there are vision changes/issues with speech/issues with balance, or numbness/weakness in a part of the body, you should be seen urgently or emergently for more serious causes of headache   HEAD INJURY: I have also discussed care of head injury, as well as red flags for emergent treatment which would mean that he needed to go to the ER immediately. Some red flags include; increased confusion, memory loss, lethargy, vision changes, nausea and vomiting, balance difficulty, extremity weakness, numbness/tingling/burning, severe headache or neck pain. I have strongly suggested that someone monitor the patient closely for the next 24-48 hours     ED Prescriptions    Medication Sig Dispense Auth. Provider    diclofenac (VOLTAREN) 50 MG EC tablet Take 1 tablet (50 mg total) by mouth 2 (two) times daily for 5 days. 10 tablet Eusebio Friendly B, PA-C   ondansetron (ZOFRAN) 4 MG tablet Take 1 tablet (4 mg total) by mouth every 6 (six) hours for 5 days. 15 tablet Gareth Morgan     PDMP not reviewed this encounter.   Shirlee Latch, PA-C 02/06/21 1407

## 2021-02-06 NOTE — ED Notes (Signed)
No prior auth needed for CT scan. 

## 2021-08-18 IMAGING — CT CT HEAD W/O CM
1 series · 16 of 29 positions shown, 20 images · non-contrast
Comparison: None.

CLINICAL DATA: Fall, facial trauma, frontal headache

EXAM:
CT HEAD WITHOUT CONTRAST
TECHNIQUE: Contiguous axial images were obtained from the base of the skull
through the vertex without intravenous contrast.

[Series 2: head wo · axial · 0.43mm/px · z∈[-78,+52]mm · 16 of 29 slices shown, 20 images]
[im 2/29  brain]
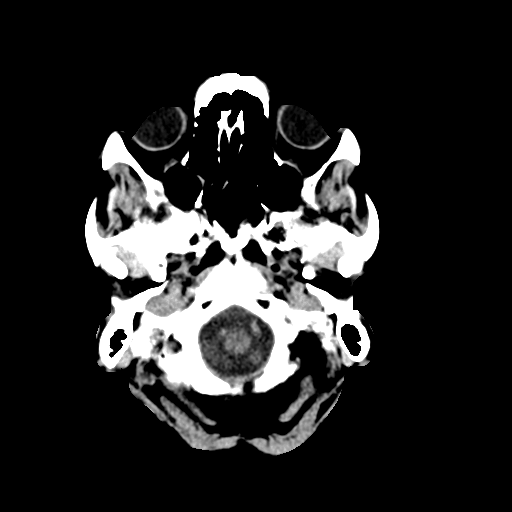
[im 2/29  bone]
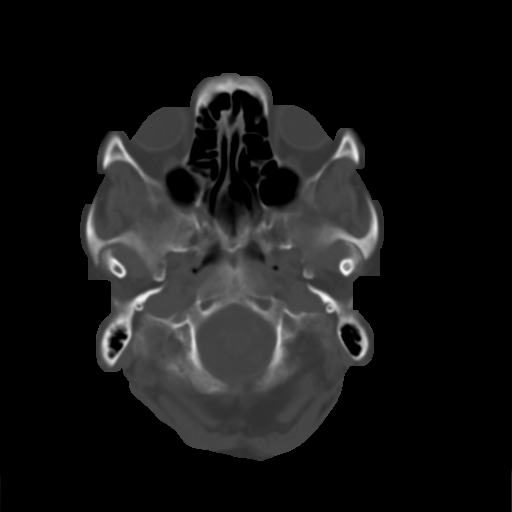
[im 4/29  brain]
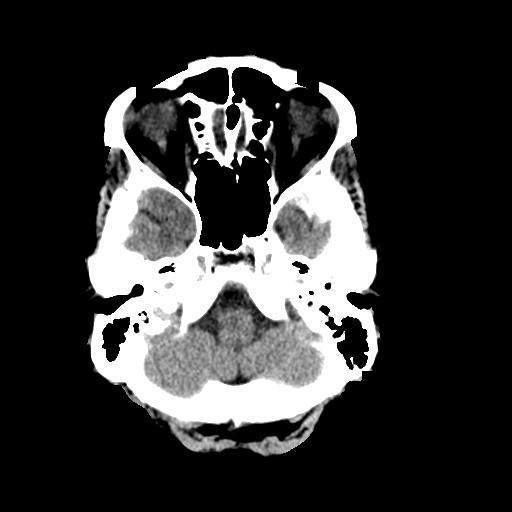
[im 6/29  brain]
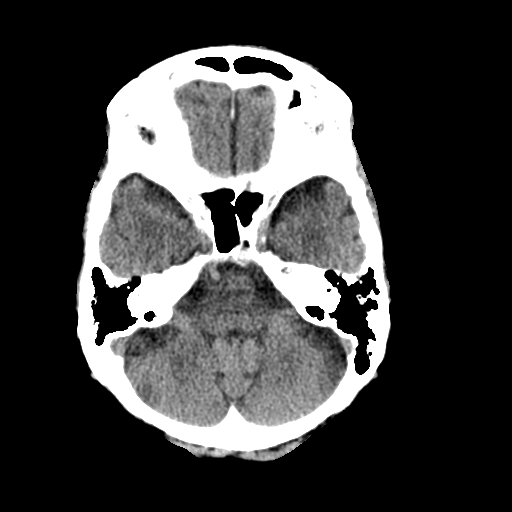
[im 7/29  brain]
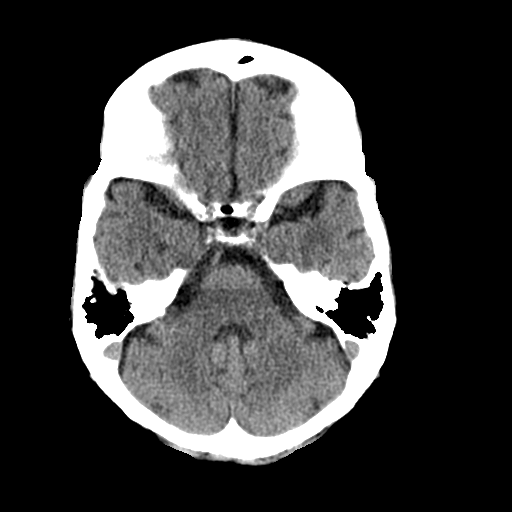
[im 9/29  brain]
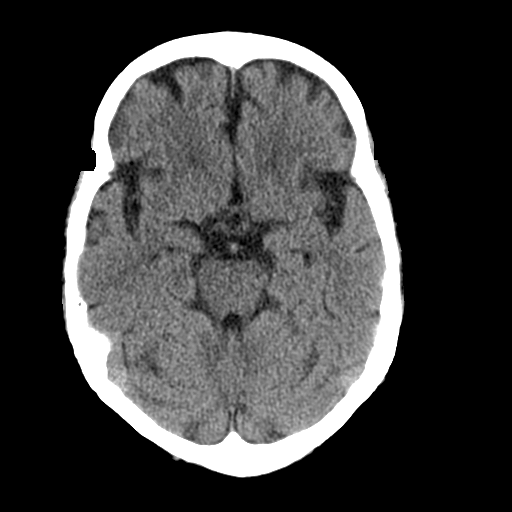
[im 9/29  bone]
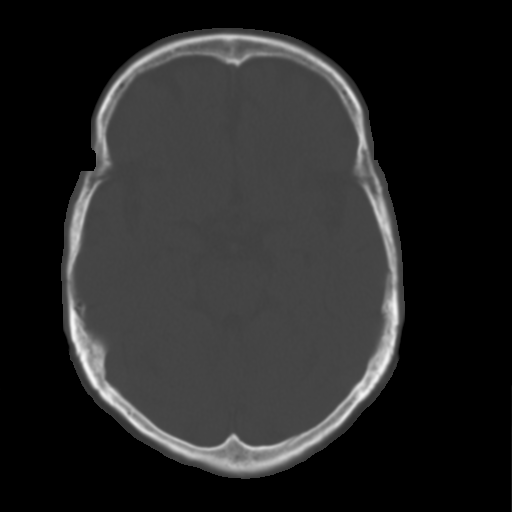
[im 11/29  brain]
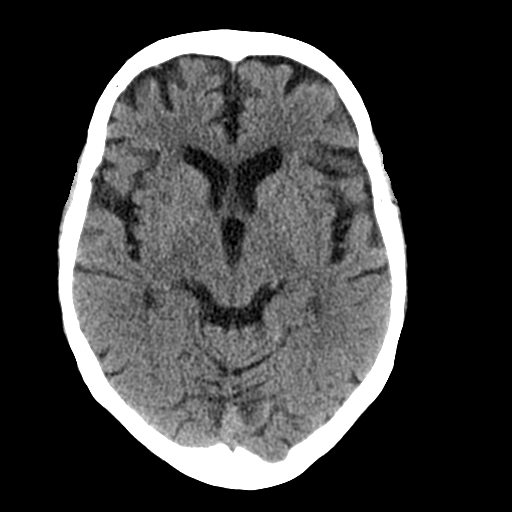
[im 12/29  brain]
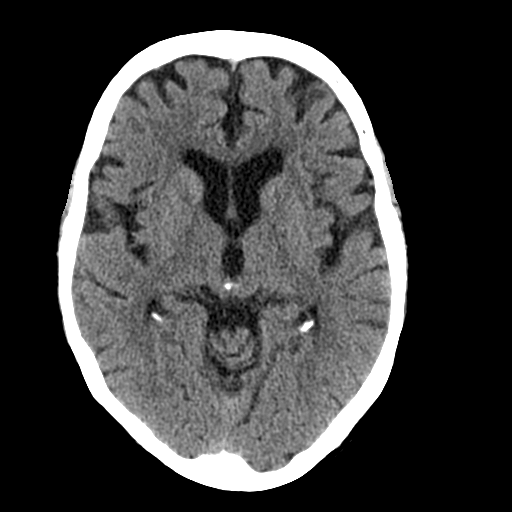
[im 14/29  brain]
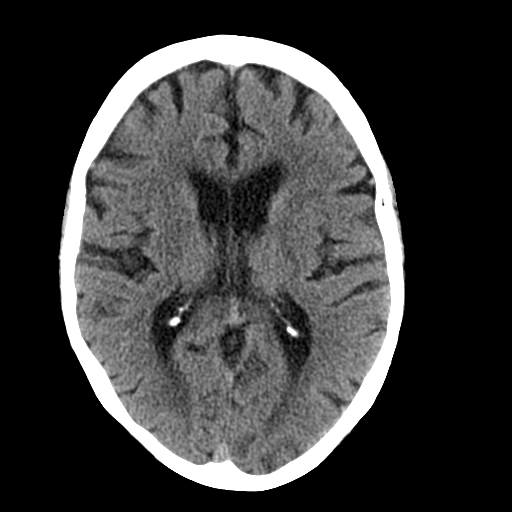
[im 16/29  brain]
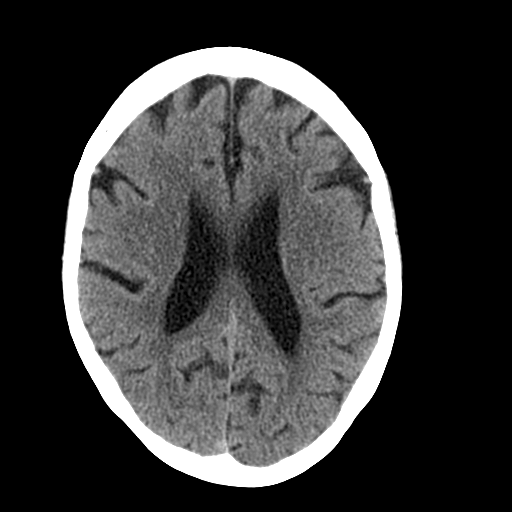
[im 16/29  bone]
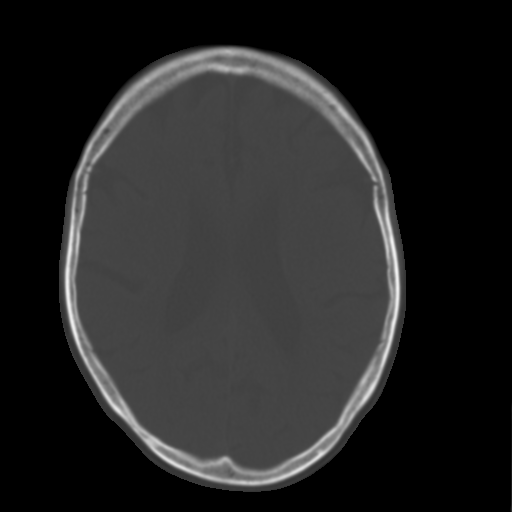
[im 18/29  brain]
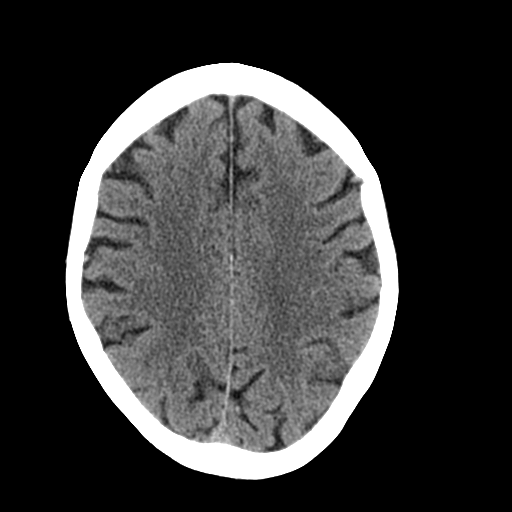
[im 19/29  brain]
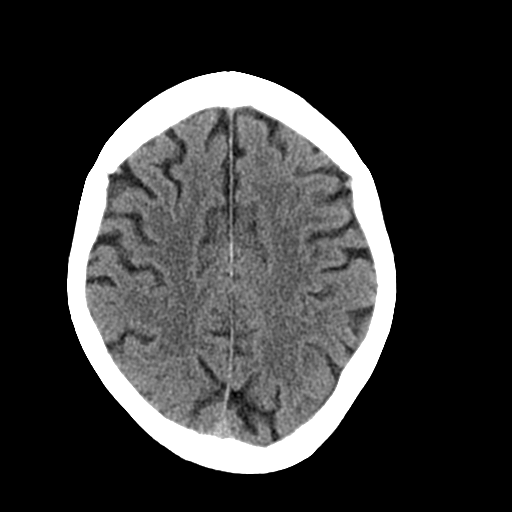
[im 21/29  brain]
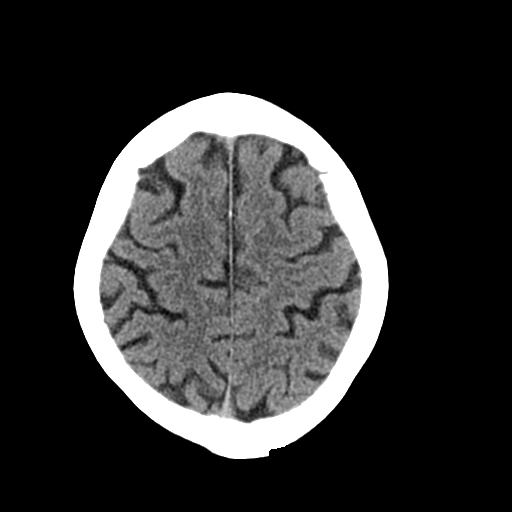
[im 23/29  brain]
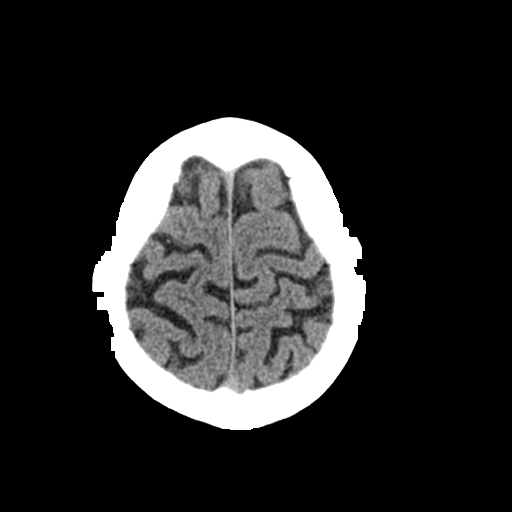
[im 23/29  bone]
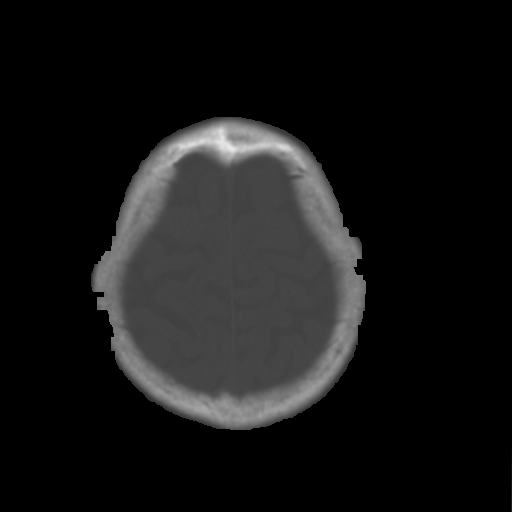
[im 24/29  brain]
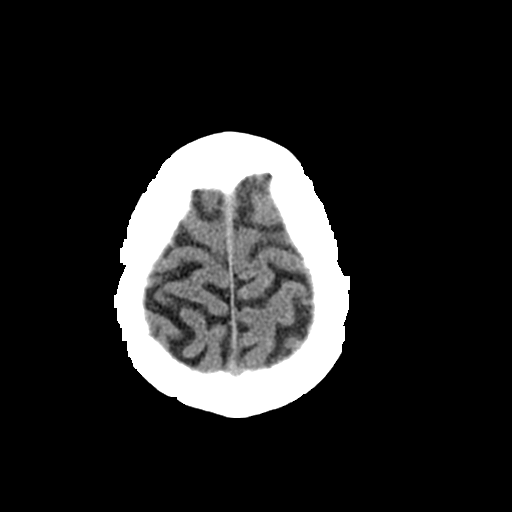
[im 26/29  brain]
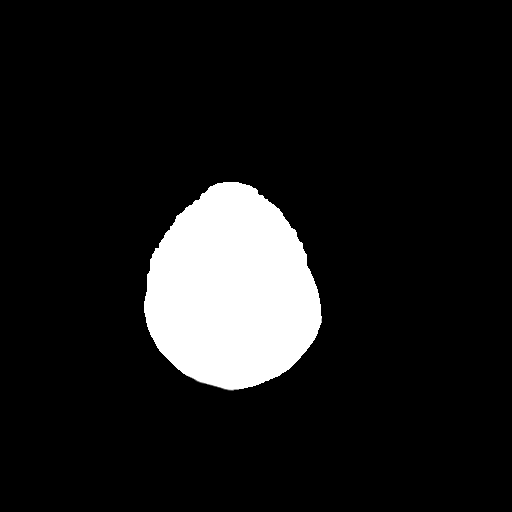
[im 28/29  brain]
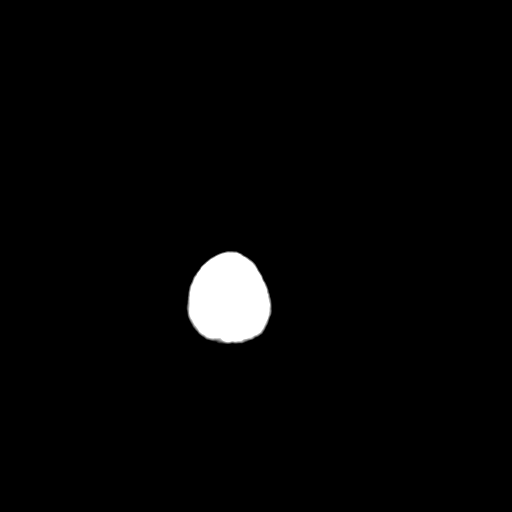

[16 of 29 positions shown; findings below may reference images not displayed]

FINDINGS: Brain: Normal anatomic configuration. Parenchymal volume loss is
commensurate with the patient's age. Mild periventricular white
matter changes are present likely reflecting the sequela of small
vessel ischemia. No abnormal intra or extra-axial mass lesion or
fluid collection. No abnormal mass effect or midline shift. No
evidence of acute intracranial hemorrhage or infarct. Ventricular
size is normal. Cerebellum unremarkable.

Vascular: No asymmetric hyperdense vasculature at the skull base.

Skull: Intact

Sinuses/Orbits: Paranasal sinuses are clear. Orbits are
unremarkable.

Other: Mastoid air cells and middle ear cavities are clear. Mild
frontal soft tissue swelling is noted.
IMPRESSION: No acute intracranial abnormality. No calvarial fracture. Mild
frontal soft tissue swelling.

## 2022-08-07 ENCOUNTER — Encounter: Payer: Self-pay | Admitting: Emergency Medicine

## 2022-08-07 ENCOUNTER — Ambulatory Visit
Admission: EM | Admit: 2022-08-07 | Discharge: 2022-08-07 | Disposition: A | Discharge status: Home / Self Care | Payer: Medicare Other | Attending: Nurse Practitioner | Admitting: Nurse Practitioner

## 2022-08-07 DIAGNOSIS — R000 Tachycardia, unspecified: Secondary | ICD-10-CM | POA: Insufficient documentation

## 2022-08-07 DIAGNOSIS — Z20822 Contact with and (suspected) exposure to covid-19: Secondary | ICD-10-CM | POA: Insufficient documentation

## 2022-08-07 DIAGNOSIS — R5383 Other fatigue: Secondary | ICD-10-CM | POA: Insufficient documentation

## 2022-08-07 DIAGNOSIS — R079 Chest pain, unspecified: Secondary | ICD-10-CM | POA: Insufficient documentation

## 2022-08-07 DIAGNOSIS — E785 Hyperlipidemia, unspecified: Secondary | ICD-10-CM | POA: Diagnosis not present

## 2022-08-07 LAB — COMPREHENSIVE METABOLIC PANEL
ALT: 22 U/L (ref 0–44)
AST: 26 U/L (ref 15–41)
Albumin: 3.4 g/dL — ABNORMAL LOW (ref 3.5–5.0)
Alkaline Phosphatase: 73 U/L (ref 38–126)
Anion gap: 7 (ref 5–15)
BUN: 14 mg/dL (ref 8–23)
CO2: 23 mmol/L (ref 22–32)
Calcium: 8.4 mg/dL — ABNORMAL LOW (ref 8.9–10.3)
Chloride: 101 mmol/L (ref 98–111)
Creatinine, Ser: 0.62 mg/dL (ref 0.44–1.00)
GFR, Estimated: >60 mL/min (ref >60)
Glucose, Bld: 103 mg/dL — ABNORMAL HIGH (ref 70–99)
Potassium: 4 mmol/L (ref 3.5–5.1)
Sodium: 131 mmol/L — ABNORMAL LOW (ref 135–145)
Total Bilirubin: 0.8 mg/dL (ref 0.3–1.2)
Total Protein: 7 g/dL (ref 6.5–8.1)

## 2022-08-07 LAB — CBC WITH DIFFERENTIAL/PLATELET
Abs Immature Granulocytes: 0.02 10*3/uL (ref 0.00–0.07)
Basophils Absolute: 0 10*3/uL (ref 0.0–0.1)
Basophils Relative: 1 %
Eosinophils Absolute: 0.1 10*3/uL (ref 0.0–0.5)
Eosinophils Relative: 2 %
HCT: 38.3 % (ref 36.0–46.0)
Hemoglobin: 13.1 g/dL (ref 12.0–15.0)
Immature Granulocytes: 0 %
Lymphocytes Relative: 18 %
Lymphs Abs: 1.2 10*3/uL (ref 0.7–4.0)
MCH: 31.3 pg (ref 26.0–34.0)
MCHC: 34.2 g/dL (ref 30.0–36.0)
MCV: 91.6 fL (ref 80.0–100.0)
Monocytes Absolute: 0.6 10*3/uL (ref 0.1–1.0)
Monocytes Relative: 10 %
Neutro Abs: 4.6 10*3/uL (ref 1.7–7.7)
Neutrophils Relative %: 69 %
Platelets: 154 10*3/uL (ref 150–400)
RBC: 4.18 MIL/uL (ref 3.87–5.11)
RDW: 14 % (ref 11.5–15.5)
WBC: 6.6 10*3/uL (ref 4.0–10.5)
nRBC: 0 % (ref 0.0–0.2)

## 2022-08-07 LAB — SARS CORONAVIRUS 2 BY RT PCR: SARS Coronavirus 2 by RT PCR: NEGATIVE

## 2022-08-07 LAB — TROPONIN I (HIGH SENSITIVITY): Troponin I (High Sensitivity): 2 ng/L (ref <18)

## 2022-08-07 NOTE — ED Provider Notes (Signed)
[facility]    CSN: [id number] Arrival date & time: 08/07/22  1520      History   Chief Complaint Chief Complaint  Patient presents with   Chest Pain   Fatigue    HPI Mindy Howe is a 76 y.o. female.   HPI  The patient complains of chest pain. The discomfort is described as pressure-like with radiation to the none. Onset of symptoms was gradual starting 3 days ago, worsening course since that time. Each episode has been ongoing. The episodes are brought on by no specific activity . The patient also complains of fatigue, decreased appetite and desire to complete her chores The patient denies headache, fever, dyspnea, abdominal pain, nausea, vomiting, leg swelling, and hemoptysis. Patient's cardiac risk factors are advanced age (older than 59 for men, 44 for women) and dyslipidemia. The patient denies risk factors of diabetes mellitus, hypertension, microalbuminuria, obesity (BMI >= 30 kg/m2), sedentary lifestyle, and smoking/ tobacco exposure.  Care prior to arrival consisted of rest, with minimal relief. She has not taken her Metoprolol today.  She endorses he loss of her son about one year ago related to cardiac issues.   Past Medical History:  Diagnosis Date   Glaucoma    Hyperlipidemia    Osteoporosis     Patient Active Problem List   Diagnosis Date Noted   Acute sialoadenitis    Leucocytosis    Sepsis (HCC) 12/03/2020    Past Surgical History:  Procedure Laterality Date   CHOLECYSTECTOMY     ROTATOR CUFF REPAIR Right    TUBAL LIGATION     UMBILICAL HERNIA REPAIR      OB History   No obstetric history on file.      Home Medications    Prior to Admission medications   Medication Sig Start Date End Date Taking? Authorizing Provider  metoprolol succinate (TOPROL-XL) 25 MG 24 hr tablet Take 0.5 tablets by mouth daily. 03/17/22  Yes [provider]  cholecalciferol (VITAMIN D) 1000 UNITS tablet Take 1,000 Units by mouth daily.     [provider]  dexamethasone (DECADRON) 2 MG tablet 8mg  daily x 1 day, 6 mg daily x 2 days, 4mg  daily x 2 days, 2mg  daily x 2 days then stop 12/04/20   , MD  EPINEPHrine 0.3 mg/0.3 mL IJ SOAJ injection Inject into the muscle. 02/21/22   [provider]  ezetimibe-simvastatin (VYTORIN) 10-20 MG per tablet Take 1 tablet by mouth at bedtime.     [provider]  latanoprost (XALATAN) 0.005 % ophthalmic solution Place 1 drop into both eyes at bedtime.     [provider]  minoxidil (LONITEN) 2.5 MG tablet 1/2 (HALF A TABLE) ORAL 2(TWO) TIMES A DAY    [provider]  naltrexone (DEPADE) 50 MG tablet Take 50 mg by mouth daily at 12 noon.  10/10/20   [provider]  risedronate (ACTONEL) 35 MG tablet Take 35 mg by mouth every 7 (seven) days. with water on empty stomach, nothing by mouth or lie down for next 30 minutes.    [provider]    Family History Family History  Problem Relation Age of Onset   Diverticulitis Mother    Heart attack Father 5   Stroke Sister 69   Heart attack Brother 23    Social History Social History   Tobacco Use   Smoking status: Former    Packs/day: 1.00    Years: 6.00    Total  pack years: 6.00    Types: Cigarettes    Quit date: 09/20/1976    Years since quitting: 45.9   Smokeless tobacco: Never  Vaping Use   Vaping Use: Never used  Substance Use Topics   Alcohol use: Not Currently    Alcohol/week: 0.0 standard drinks of alcohol    Comment: occasionally- quit 03/18/20   Drug use: No     Allergies   Solifenacin   Review of Systems Review of Systems   Physical Exam Triage Vital Signs ED Triage Vitals  Enc Vitals Group     BP 08/07/22 1534 129/84     Pulse Rate 08/07/22 1534 (!) 103     Resp 08/07/22 1534 18     Temp 08/07/22 1534 100.2 F (37.9 C)     Temp Source 08/07/22 1534 Oral     SpO2 08/07/22 1534 96 %     Weight --      Height --      Head  Circumference --      Peak Flow --      Pain Score 08/07/22 1530 5     Pain Loc --      Pain Edu? --      Excl. in GC? --    No data found.  Updated Vital Signs BP 129/84 (BP Location: Left Arm)   Pulse (!) 103   Temp 100.2 F (37.9 C) (Oral)   Resp 18   SpO2 96%   Visual Acuity Right Eye Distance:   Left Eye Distance:   Bilateral Distance:    Right Eye Near:   Left Eye Near:    Bilateral Near:     Physical Exam Constitutional:      General: She is not in acute distress. HENT:     Head: Normocephalic and atraumatic.  Cardiovascular:     Rate and Rhythm: Regular rhythm. Tachycardia present.     Pulses:          Carotid pulses are 2+ on the right side.      Radial pulses are 2+ on the right side.       Posterior tibial pulses are 2+ on the right side and 2+ on the left side.     Heart sounds: Normal heart sounds.  Pulmonary:     Effort: Pulmonary effort is normal.     Breath sounds: Normal breath sounds. No wheezing, rhonchi or rales.  Chest:     Chest wall: No mass.  Musculoskeletal:        General: Normal range of motion.     Cervical back: Normal range of motion.  Skin:    General: Skin is warm and dry.     Capillary Refill: Capillary refill takes less than 2 seconds.  Neurological:     General: No focal deficit present.     Mental Status: She is alert.  Psychiatric:        Mood and Affect: Mood normal.        Behavior: Behavior normal.      UC Treatments / Results  Labs (all labs ordered are listed, but only abnormal results are displayed) Labs Reviewed  COMPREHENSIVE METABOLIC PANEL - Abnormal; Notable for the following components:      Result Value   Sodium 131 (*)    Glucose, Bld 103 (*)    Calcium 8.4 (*)    Albumin 3.4 (*)    All other components within normal limits  SARS CORONAVIRUS 2 BY RT PCR  CBC WITH DIFFERENTIAL/PLATELET  TROPONIN I (HIGH SENSITIVITY)    EKG   Radiology No results found.  Procedures Procedures (including  critical care time)  Medications Ordered in UC Medications - No data to display  Initial Impression / Assessment and Plan / UC Course  I have reviewed the triage vital signs and the nursing notes.  Pertinent labs & imaging results that were available during my care of the patient were reviewed by me and considered in my medical decision making (see chart for details).     Chest pain Tachycardia fatigue Final Clinical Impressions(s) / UC Diagnoses   Final diagnoses:  Chest pain, unspecified type  Tachycardia  Fatigue, unspecified type     Discharge Instructions      ECG sinus tachycardia, Troponin normal range Encourage you to take your Metoprolol as directed today  Follow up with Primary Care Provider for additional labs for fatigue Follow up with cardiology within the next 5-7 days evaluation of treatment and additional work up.       ED Prescriptions   None    PDMP not reviewed this encounter.   Thad Ranger [place], [place] 08/07/22 1816

## 2022-08-07 NOTE — ED Triage Notes (Signed)
Pt presents with fatigue x 3 days and chest pain that started today. She feels pain and pressure in the center of her chest. Pt denies any limb pain, SOB or blurred vision. She does report some mild dizziness.

## 2022-08-07 NOTE — Discharge Instructions (Addendum)
ECG sinus tachycardia, Troponin normal range Encourage you to take your Metoprolol as directed today  Follow up with Primary Care Provider for additional labs for fatigue Follow up with cardiology within the next 5-7 days evaluation of treatment and additional work up.

## 2022-12-01 ENCOUNTER — Other Ambulatory Visit: Payer: Self-pay | Admitting: Physician Assistant

## 2022-12-01 DIAGNOSIS — R0789 Other chest pain: Secondary | ICD-10-CM

## 2022-12-01 DIAGNOSIS — I700 Atherosclerosis of aorta: Secondary | ICD-10-CM

## 2022-12-01 DIAGNOSIS — E785 Hyperlipidemia, unspecified: Secondary | ICD-10-CM

## 2022-12-01 DIAGNOSIS — I10 Essential (primary) hypertension: Secondary | ICD-10-CM

## 2022-12-10 ENCOUNTER — Telehealth (HOSPITAL_COMMUNITY): Payer: Self-pay | Admitting: Emergency Medicine

## 2022-12-10 DIAGNOSIS — R079 Chest pain, unspecified: Secondary | ICD-10-CM

## 2022-12-10 MED ORDER — METOPROLOL TARTRATE 100 MG PO TABS: 100.0000 mg | ORAL_TABLET | Freq: Once | ORAL | 0 refills | Status: AC

## 2022-12-10 NOTE — Telephone Encounter (Signed)
Reaching out to patient to offer assistance regarding upcoming cardiac imaging study; pt verbalizes understanding of appt date/time, parking situation and where to check in, pre-test NPO status and medications ordered, and verified current allergies; name and call back number provided for further questions should they arise Rockwell Alexandria RN Navigator Cardiac Imaging [facility] [phone] office [phone] cell  Arrival 1245 Sending one time dose 100mg  metoprolol Pt states HR recorded at [facility] office visit was false, HR in 80s, sent 100mg  metoprolol to pharm and asked to hold toprol xl. Pt verbalized understanding

## 2022-12-11 ENCOUNTER — Ambulatory Visit
Admission: RE | Admit: 2022-12-11 | Discharge: 2022-12-11 | Disposition: A | Discharge status: Home / Self Care | Payer: Medicare Other | Source: Ambulatory Visit | Attending: Physician Assistant | Admitting: Physician Assistant

## 2022-12-11 DIAGNOSIS — E785 Hyperlipidemia, unspecified: Secondary | ICD-10-CM | POA: Diagnosis present

## 2022-12-11 DIAGNOSIS — R0789 Other chest pain: Secondary | ICD-10-CM | POA: Diagnosis present

## 2022-12-11 DIAGNOSIS — I10 Essential (primary) hypertension: Secondary | ICD-10-CM | POA: Diagnosis present

## 2022-12-11 DIAGNOSIS — I700 Atherosclerosis of aorta: Secondary | ICD-10-CM | POA: Insufficient documentation

## 2022-12-11 LAB — POCT I-STAT CREATININE: Creatinine, Ser: 0.7 mg/dL (ref 0.44–1.00)

## 2022-12-11 MED ORDER — DILTIAZEM HCL 25 MG/5ML IV SOLN
5.0000 mg | Freq: Once | INTRAVENOUS | Status: AC
  Administered 2022-12-11: 5 mg via INTRAVENOUS

## 2022-12-11 MED ORDER — METOPROLOL TARTRATE 5 MG/5ML IV SOLN
10.0000 mg | Freq: Once | INTRAVENOUS | Status: AC
  Administered 2022-12-11: 10 mg via INTRAVENOUS

## 2022-12-11 MED ORDER — NITROGLYCERIN 0.4 MG SL SUBL
0.8000 mg | SUBLINGUAL_TABLET | Freq: Once | SUBLINGUAL | Status: AC
  Administered 2022-12-11: 0.8 mg via SUBLINGUAL

## 2022-12-11 MED ORDER — IOHEXOL 350 MG/ML SOLN
75.0000 mL | Freq: Once | INTRAVENOUS | Status: AC | PRN
  Administered 2022-12-11: 75 mL via INTRAVENOUS

## 2022-12-11 MED ORDER — DILTIAZEM HCL 25 MG/5ML IV SOLN
10.0000 mg | Freq: Once | INTRAVENOUS | Status: AC
  Administered 2022-12-11: 10 mg via INTRAVENOUS

## 2022-12-11 NOTE — Progress Notes (Signed)
Patient tolerated procedure well. Ambulate w/o difficulty. Denies any lightheadedness or being dizzy. Pt denies any pain at this time. Sitting in chair, drinking water provided. P is encouraged to drink additional water throughout the day and reason explained to patient. Patient verbalized understanding and all questions answered. ABC intact. No further needs at this time. Discharge from procedure area w/o issues.  °

## 2023-01-20 ENCOUNTER — Ambulatory Visit
Admission: EM | Admit: 2023-01-20 | Discharge: 2023-01-20 | Disposition: A | Discharge status: Home / Self Care | Payer: Medicare Other | Attending: Emergency Medicine | Admitting: Emergency Medicine

## 2023-01-20 ENCOUNTER — Encounter: Payer: Self-pay | Admitting: Emergency Medicine

## 2023-01-20 DIAGNOSIS — U071 COVID-19: Secondary | ICD-10-CM | POA: Diagnosis present

## 2023-01-20 LAB — RESP PANEL BY RT-PCR (RSV, FLU A&B, COVID)  RVPGX2
Influenza A by PCR: NEGATIVE
Influenza B by PCR: NEGATIVE
Resp Syncytial Virus by PCR: NEGATIVE
SARS Coronavirus 2 by RT PCR: POSITIVE — AB

## 2023-01-20 MED ORDER — IPRATROPIUM BROMIDE 0.06 % NA SOLN: 2.0000 | Freq: Four times a day (QID) | NASAL | 0 refills | Status: DC

## 2023-01-20 MED ORDER — NIRMATRELVIR/RITONAVIR (PAXLOVID)TABLET: 3.0000 | ORAL_TABLET | Freq: Two times a day (BID) | ORAL | 0 refills | Status: AC

## 2023-01-20 NOTE — Discharge Instructions (Signed)
Hold the Vytorin while taking the Paxlovid.  Finish Paxlovid, even if you feel better.  1000 mg of Tylenol 3 times a day as needed.  Saline nasal irrigation with a NeilMed sinus rinse and distilled water as often as you want, Atrovent nasal spray and guaifenesin.  Discontinue the loratadine.  To the ER if you have difficulty breathing, or for any concerns.

## 2023-01-20 NOTE — ED Triage Notes (Signed)
Pt presents with a cough, sinus pressure, headache, bodyaches and cough started today.

## 2023-01-20 NOTE — ED Provider Notes (Signed)
HPI  SUBJECTIVE:  Mindy Howe is a 77 y.o. female who presents with 5 days of days, fatigue, headache, clear rhinorrhea, fevers Tmax 100.2, body aches, postnasal drip, cough that is occasionally productive of phlegm.  She states that her symptoms got worse today.  No sore throat, loss of sense of smell or taste, wheezing or shortness of breath, nausea, vomiting or diarrhea, abdominal pain.  No known COVID, flu, RSV exposure.  She had 5 doses of the COVID-vaccine and also got this years flu vaccine.  She did not get the RSV vaccine.  No antipyretic in the past 6 hours.  She tried Tylenol 1000 mg and loratadine.  The Tylenol helps.  No aggravating factors.  She has a past medical history of coronary disease, hypercholesterolemia for which she takes Vytorin, SVT.  No history of diabetes, chronic kidney disease.  PCP: [facility].    Past Medical History:  Diagnosis Date   Glaucoma    Hyperlipidemia    Osteoporosis     Past Surgical History:  Procedure Laterality Date   CHOLECYSTECTOMY     ROTATOR CUFF REPAIR Right    TUBAL LIGATION     UMBILICAL HERNIA REPAIR      Family History  Problem Relation Age of Onset   Diverticulitis Mother    Heart attack Father 48   Stroke Sister 18   Heart attack Brother 23    Social History   Tobacco Use   Smoking status: Former    Packs/day: 1.00    Years: 6.00    Total pack years: 6.00    Types: Cigarettes    Quit date: 09/20/1976    Years since quitting: 46.3   Smokeless tobacco: Never  Vaping Use   Vaping Use: Never used  Substance Use Topics   Alcohol use: Not Currently    Alcohol/week: 0.0 standard drinks of alcohol    Comment: occasionally- quit 03/18/20   Drug use: No    No current facility-administered medications for this encounter.  Current Outpatient Medications:    ipratropium (ATROVENT) 0.06 % nasal spray, Place 2 sprays into both nostrils 4 (four) times daily., Disp: 15 mL, Rfl: 0   nirmatrelvir/ritonavir  (PAXLOVID) 20 x 150 MG & 10 x 100MG  TABS, Take 3 tablets by mouth 2 (two) times daily for 5 days. Patient GFR is >60. Take nirmatrelvir (150 mg) two tablets twice daily for 5 days and ritonavir (100 mg) one tablet twice daily for 5 days., Disp: 30 tablet, Rfl: 0   cholecalciferol (VITAMIN D) 1000 UNITS tablet, Take 1,000 Units by mouth daily., Disp: , Rfl:    EPINEPHrine 0.3 mg/0.3 mL IJ SOAJ injection, Inject into the muscle., Disp: , Rfl:    latanoprost (XALATAN) 0.005 % ophthalmic solution, Place 1 drop into both eyes at bedtime. , Disp: , Rfl:    metoprolol succinate (TOPROL-XL) 25 MG 24 hr tablet, Take 0.5 tablets by mouth daily., Disp: , Rfl:    metoprolol tartrate (LOPRESSOR) 100 MG tablet, Take 1 tablet (100 mg total) by mouth once for 1 dose. Please take one time dose 100mg  metoprolol tartrate 2 hr prior to cardiac CT for HR control IF HR >55bpm., Disp: 1 tablet, Rfl: 0   minoxidil (LONITEN) 2.5 MG tablet, 1/2 (HALF A TABLE) ORAL 2(TWO) TIMES A DAY, Disp: , Rfl:    naltrexone (DEPADE) 50 MG tablet, Take 50 mg by mouth daily at 12 noon. , Disp: , Rfl:    risedronate (ACTONEL) 35 MG tablet, Take  35 mg by mouth every 7 (seven) days. with water on empty stomach, nothing by mouth or lie down for next 30 minutes., Disp: , Rfl:   Allergies  Allergen Reactions   Solifenacin     Other reaction(s): Headache     ROS  As noted in HPI.   Physical Exam  BP (!) 115/56 (BP Location: Left Arm)   Pulse 87   Temp 98.7 F (37.1 C) (Oral)   Resp 16   SpO2 100%   Constitutional: Well developed, well nourished, no acute distress Eyes:  EOMI, conjunctiva normal bilaterally HENT: Normocephalic, atraumatic,mucus membranes moist.  Clear nasal congestion.  Erythematous, swollen turbinates.  No maxillary, frontal sinus tenderness.  Normal oropharynx, tonsils normal without exudates.  No postnasal drip. Neck: Positive cervical lymphadenopathy Respiratory: Normal inspiratory effort, lungs clear  bilaterally Cardiovascular: Normal rate, regular rhythm, no murmurs rubs or gallops GI: nondistended skin: No rash, skin intact Musculoskeletal: no deformities Neurologic: Alert & oriented x 3, no focal neuro deficits Psychiatric: Speech and behavior appropriate   ED Course   Medications - No data to display  Orders Placed This Encounter  Procedures   Resp panel by RT-PCR (RSV, Flu A&B, Covid) Anterior Nasal Swab    Standing Status:   Standing    Number of Occurrences:   1   Airborne and Contact precautions    Standing Status:   Standing    Number of Occurrences:   1    Results for orders placed or performed during the hospital encounter of 01/20/23 (from the past 24 hour(s))  Resp panel by RT-PCR (RSV, Flu A&B, Covid) Anterior Nasal Swab     Status: Abnormal   Collection Time: 01/20/23  1:55 PM   Specimen: Anterior Nasal Swab  Result Value Ref Range   SARS Coronavirus 2 by RT PCR POSITIVE (A) NEGATIVE   Influenza A by PCR NEGATIVE NEGATIVE   Influenza B by PCR NEGATIVE NEGATIVE   Resp Syncytial Virus by PCR NEGATIVE NEGATIVE   No results found.  ED Clinical Impression  1. COVID-19 virus infection      ED Assessment/Plan     COVID-positive  GFR from August 23 >60.  No evidence of pneumonia or bacterial sinusitis at this time.  Home with Paxlovid due to age and comorbidities of hypercholesterolemia coronary artery disease.  Also advised saline nasal rogation, Tylenol, Atrovent nasal spray, Mucinex.  Discontinue loratadine.  Hold statin while taking the Paxlovid.  Discussed rebound COVID symptoms.  Follow-up with PCP as needed.   Discussed labs,  MDM, treatment plan, and plan for follow-up with patient.  patient agrees with plan.   Meds ordered this encounter  Medications   nirmatrelvir/ritonavir (PAXLOVID) 20 x 150 MG & 10 x 100MG  TABS    Sig: Take 3 tablets by mouth 2 (two) times daily for 5 days. Patient GFR is >60. Take nirmatrelvir (150 mg) two tablets  twice daily for 5 days and ritonavir (100 mg) one tablet twice daily for 5 days.    Dispense:  30 tablet    Refill:  0   ipratropium (ATROVENT) 0.06 % nasal spray    Sig: Place 2 sprays into both nostrils 4 (four) times daily.    Dispense:  15 mL    Refill:  0      *This clinic note was created using [employer]. Therefore, there may be occasional mistakes despite careful proofreading.  ?    Melynda Ripple, MD 01/21/23 [phone]

## 2023-08-08 ENCOUNTER — Ambulatory Visit
Admission: EM | Admit: 2023-08-08 | Discharge: 2023-08-08 | Disposition: A | Discharge status: Home / Self Care | Payer: Medicare Other

## 2023-08-08 DIAGNOSIS — M5441 Lumbago with sciatica, right side: Secondary | ICD-10-CM | POA: Diagnosis not present

## 2023-08-08 MED ORDER — PREDNISONE 10 MG (21) PO TBPK: ORAL_TABLET | ORAL | 0 refills | Status: DC

## 2023-08-08 MED ORDER — BACLOFEN 10 MG PO TABS: 10.0000 mg | ORAL_TABLET | Freq: Three times a day (TID) | ORAL | 0 refills | Status: DC

## 2023-08-08 MED ORDER — DEXAMETHASONE SODIUM PHOSPHATE 10 MG/ML IJ SOLN
10.0000 mg | Freq: Once | INTRAMUSCULAR | Status: AC
  Administered 2023-08-08: 10 mg via INTRAMUSCULAR

## 2023-08-08 NOTE — ED Triage Notes (Signed)
Pt c/o low back pain x1 wk. Recently returned from [place] was 20 hrs on plane which aggravated back. Hx of bulging discs of spine.

## 2023-08-08 NOTE — ED Provider Notes (Signed)
[facility]    CSN: [id number] Arrival date & time: 08/08/23  1242      History   Chief Complaint Chief Complaint  Patient presents with   Back Pain    HPI Nikhita Kadian Murrey is a 77 y.o. female.   HPI  77 year old female with a past medical history significant for glaucoma, hyperlipidemia, and sepsis presents for evaluation of 1 week with low back pain.  She states she recently returned from [place] and spent 20 hours on the plane which aggravated her back.  She does have a history of bulging disks.  Past Medical History:  Diagnosis Date   Glaucoma    Hyperlipidemia    Osteoporosis     Patient Active Problem List   Diagnosis Date Noted   Acute sialoadenitis    Leucocytosis    Sepsis (HCC) 12/03/2020    Past Surgical History:  Procedure Laterality Date   CHOLECYSTECTOMY     ROTATOR CUFF REPAIR Right    TUBAL LIGATION     UMBILICAL HERNIA REPAIR      OB History   No obstetric history on file.      Home Medications    Prior to Admission medications   Medication Sig Start Date End Date Taking? Authorizing Provider  albuterol (VENTOLIN HFA) 108 (90 Base) MCG/ACT inhaler Inhale into the lungs. 02/11/23 02/11/24 Yes [provider]  baclofen (LIORESAL) 10 MG tablet Take 1 tablet (10 mg total) by mouth 3 (three) times daily. 08/08/23  Yes [name], NP  cholecalciferol (VITAMIN D) 1000 UNITS tablet Take 1,000 Units by mouth daily.   Yes [provider]  EPINEPHrine 0.3 mg/0.3 mL IJ SOAJ injection Inject into the muscle. 02/21/22  Yes [provider]  ezetimibe-simvastatin (VYTORIN) 10-20 MG tablet Take 1 tablet by mouth at bedtime. 07/10/23  Yes [provider]  gabapentin (NEURONTIN) 100 MG capsule Take by mouth. 04/03/23 04/02/24 Yes [provider]  HYDROcodone-acetaminophen (NORCO/VICODIN) 5-325 MG tablet Take 1 tablet by mouth 4 (four) times daily as needed.   Yes [provider]  ipratropium  (ATROVENT) 0.06 % nasal spray Place 2 sprays into both nostrils 4 (four) times daily. 01/20/23  Yes [name], MD  latanoprost (XALATAN) 0.005 % ophthalmic solution Place 1 drop into both eyes at bedtime.    Yes [provider]  metoprolol succinate (TOPROL-XL) 25 MG 24 hr tablet Take 0.5 tablets by mouth daily. 03/17/22  Yes [provider]  minoxidil (LONITEN) 2.5 MG tablet 1/2 (HALF A TABLE) ORAL 2(TWO) TIMES A DAY   Yes [provider]  naltrexone (DEPADE) 50 MG tablet Take 50 mg by mouth daily at 12 noon.  10/10/20  Yes [provider]  predniSONE (STERAPRED UNI-PAK 21 TAB) 10 MG (21) TBPK tablet Take 6 tablets on day 1, 5 tablets day 2, 4 tablets day 3, 3 tablets day 4, 2 tablets day 5, 1 tablet day 6 08/08/23  Yes [name], NP  risedronate (ACTONEL) 35 MG tablet Take 35 mg by mouth every 7 (seven) days. with water on empty stomach, nothing by mouth or lie down for next 30 minutes.   Yes [provider]  metoprolol tartrate (LOPRESSOR) 100 MG tablet Take 1 tablet (100 mg total) by mouth once for 1 dose. Please take one time dose 100mg  metoprolol tartrate 2 hr prior to cardiac CT for HR control IF HR >55bpm. 12/10/22 12/10/22  Debbe Odea, MD    Family History Family History  Problem Relation  Age of Onset   Diverticulitis Mother    Heart attack Father 57   Stroke Sister 36   Heart attack Brother 28    Social History Social History   Tobacco Use   Smoking status: Former    Current packs/day: 0.00    Average packs/day: 1 pack/day for 6.0 years (6.0 ttl pk-yrs)    Types: Cigarettes    Start date: 09/20/1970    Quit date: 09/20/1976    Years since quitting: 46.9   Smokeless tobacco: Never  Vaping Use   Vaping status: Never Used  Substance Use Topics   Alcohol use: Not Currently    Alcohol/week: 0.0 standard drinks of alcohol    Comment: occasionally- quit 03/18/20   Drug use: No     Allergies    Solifenacin   Review of Systems Review of Systems  Musculoskeletal:  Positive for back pain.  Neurological:  Negative for weakness and numbness.     Physical Exam Triage Vital Signs ED Triage Vitals  Encounter Vitals Group     BP 08/08/23 1256 (!) 120/59     Systolic BP Percentile --      Diastolic BP Percentile --      Pulse Rate 08/08/23 1256 77     Resp 08/08/23 1256 16     Temp 08/08/23 1256 98 F (36.7 C)     Temp Source 08/08/23 1256 Oral     SpO2 08/08/23 1256 98 %     Weight 08/08/23 1255 115 lb (52.2 kg)     Height 08/08/23 1255 5' 1.5" (1.562 m)     Head Circumference --      Peak Flow --      Pain Score 08/08/23 1259 7     Pain Loc --      Pain Education --      Exclude from Growth Chart --    No data found.  Updated Vital Signs BP (!) 120/59 (BP Location: Left Arm)   Pulse 77   Temp 98 F (36.7 C) (Oral)   Resp 16   Ht 5' 1.5" (1.562 m)   Wt 115 lb (52.2 kg)   SpO2 98%   BMI 21.38 kg/m   Visual Acuity Right Eye Distance:   Left Eye Distance:   Bilateral Distance:    Right Eye Near:   Left Eye Near:    Bilateral Near:     Physical Exam Vitals and nursing note reviewed.  Constitutional:      Appearance: Normal appearance. She is not ill-appearing.  Musculoskeletal:        General: Tenderness present. No signs of injury.  Skin:    General: Skin is warm and dry.     Capillary Refill: Capillary refill takes less than 2 seconds.  Neurological:     General: No focal deficit present.     Mental Status: She is alert and oriented to person, place, and time.     Motor: No weakness.  Psychiatric:        Mood and Affect: Mood normal.        Behavior: Behavior normal.        Thought Content: Thought content normal.        Judgment: Judgment normal.      UC Treatments / Results  Labs (all labs ordered are listed, but only abnormal results are displayed) Labs Reviewed - No data to display  EKG   Radiology No results  found.  Procedures Procedures (including critical care time)  Medications Ordered in UC Medications  dexamethasone (DECADRON) injection 10 mg (has no administration in time range)    Initial Impression / Assessment and Plan / UC Course  I have reviewed the triage vital signs and the nursing notes.  Pertinent labs & imaging results that were available during my care of the patient were reviewed by me and considered in my medical decision making (see chart for details).   Patient is a nontoxic-appearing 77 year old female presenting for evaluation 1 week with low back pain.  She has a history of bulging disc in her back and she has been evaluated by [facility].  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  She reports that when she flies her back is aggravated.  She she has been to physical therapy in the past but she is not in physical therapy currently.  She reports that when she does get flares prednisone is what helps her the most.  On exam she has no midline spinous process tenderness or step-off.  There is tenderness and muscle spasm in the right lower lumbar paraspinous region.  The left lumbar paraspinous region is unremarkable.  She does have a positive seated straight leg raise on the right and negative straight leg raise on the left.  Bilateral lower extremity strength is 5/5.  I will treat the patient with a 6-day prednisone taper and baclofen 10 mg every 8 hours along with home physical therapy.  I will order 10 mg of IM Decadron to be administered in clinic and she can start the prednisone in the morning with breakfast.  Return precautions reviewed.   Final Clinical Impressions(s) / UC Diagnoses   Final diagnoses:  Acute bilateral low back pain with right-sided sciatica     Discharge Instructions      Take the prednisone according to the package instructions.  You will take it each morning with  breakfast.  Make sure that you are always taking it with food.  Take the baclofen every 8 hours as needed for muscle spasm.  Follow the rehabilitation exercises in your discharge paperwork to help you with your sciatic pain.  Return for reevaluation for new or worsening symptoms, or see your primary care provider.      ED Prescriptions     Medication Sig Dispense Auth. Provider   predniSONE (STERAPRED UNI-PAK 21 TAB) 10 MG (21) TBPK tablet Take 6 tablets on day 1, 5 tablets day 2, 4 tablets day 3, 3 tablets day 4, 2 tablets day 5, 1 tablet day 6 21 tablet [name], NP   baclofen (LIORESAL) 10 MG tablet Take 1 tablet (10 mg total) by mouth 3 (three) times daily. 30 each [name], NP      PDMP not reviewed this encounter.   [name], NP 08/08/23 [phone]

## 2023-08-08 NOTE — Discharge Instructions (Addendum)
Take the prednisone according to the package instructions.  You will take it each morning with breakfast.  Make sure that you are always taking it with food.  Take the baclofen every 8 hours as needed for muscle spasm.  Follow the rehabilitation exercises in your discharge paperwork to help you with your sciatic pain.  Return for reevaluation for new or worsening symptoms, or see your primary care provider.  

## 2023-09-09 ENCOUNTER — Ambulatory Visit
Admission: EM | Admit: 2023-09-09 | Discharge: 2023-09-09 | Disposition: A | Discharge status: Home / Self Care | Payer: Medicare Other | Attending: Emergency Medicine | Admitting: Emergency Medicine

## 2023-09-09 ENCOUNTER — Other Ambulatory Visit: Payer: Self-pay

## 2023-09-09 DIAGNOSIS — H1033 Unspecified acute conjunctivitis, bilateral: Secondary | ICD-10-CM | POA: Diagnosis not present

## 2023-09-09 HISTORY — DX: Supraventricular tachycardia, unspecified: I47.10

## 2023-09-09 MED ORDER — ERYTHROMYCIN 5 MG/GM OP OINT: TOPICAL_OINTMENT | OPHTHALMIC | 0 refills | Status: DC

## 2023-09-09 NOTE — Discharge Instructions (Addendum)
Use warm moist cotton balls to clean eyes inner to outer area and then throw away,repeat as needed. Use eye ointment as directed. Follow up with your eye doctor, if you develop eye pain, loss of vision or worsening issues go immediately to the ER.

## 2023-09-09 NOTE — ED Triage Notes (Signed)
Pt c/o yellow eye drainage in both eyes and symptoms started yesterday with a little blurred vision pt denies pain.

## 2023-09-09 NOTE — ED Provider Notes (Signed)
[facility]    CSN: [id number] Arrival date & time: 09/09/23  0914      History   Chief Complaint Chief Complaint  Patient presents with   Eye Drainage    HPI Mindy Howe is a 77 y.o. female.   77 year old female, Mindy Howe, presents to urgent care for evaluation of bilateral yellow eye drainage x 1 day, little blurry with drainage,no pain. Pt denies any trauma or injury.  The history is provided by the patient. No language interpreter was used.    Past Medical History:  Diagnosis Date   Glaucoma    Hyperlipidemia    Osteoporosis    SVT (supraventricular tachycardia)     Patient Active Problem List   Diagnosis Date Noted   Acute bacterial conjunctivitis of both eyes 09/09/2023   Acute sialoadenitis    Leucocytosis    Sepsis (HCC) 12/03/2020    Past Surgical History:  Procedure Laterality Date   CHOLECYSTECTOMY     ROTATOR CUFF REPAIR Right    TUBAL LIGATION     UMBILICAL HERNIA REPAIR      OB History   No obstetric history on file.      Home Medications    Prior to Admission medications   Medication Sig Start Date End Date Taking? Authorizing Provider  erythromycin ophthalmic ointment Place a 1/2 inch ribbon of ointment into bilateral lower eyelid every 6 hours x 5 days 09/09/23  Yes Caliegh Middlekauff, Para March, NP  albuterol (VENTOLIN HFA) 108 (90 Base) MCG/ACT inhaler Inhale into the lungs. 02/11/23 02/11/24  [provider]  baclofen (LIORESAL) 10 MG tablet Take 1 tablet (10 mg total) by mouth 3 (three) times daily. 08/08/23   [name], NP  cholecalciferol (VITAMIN D) 1000 UNITS tablet Take 1,000 Units by mouth daily.    [provider]  EPINEPHrine 0.3 mg/0.3 mL IJ SOAJ injection Inject into the muscle. 02/21/22   [provider]  ezetimibe-simvastatin (VYTORIN) 10-20 MG tablet Take 1 tablet by mouth at bedtime. 07/10/23   [provider]  gabapentin (NEURONTIN) 100 MG capsule Take by mouth.  04/03/23 04/02/24  [provider]  HYDROcodone-acetaminophen (NORCO/VICODIN) 5-325 MG tablet Take 1 tablet by mouth 4 (four) times daily as needed.    [provider]  ipratropium (ATROVENT) 0.06 % nasal spray Place 2 sprays into both nostrils 4 (four) times daily. 01/20/23   [name], MD  latanoprost (XALATAN) 0.005 % ophthalmic solution Place 1 drop into both eyes at bedtime.     [provider]  metoprolol succinate (TOPROL-XL) 25 MG 24 hr tablet Take 0.5 tablets by mouth daily. 03/17/22   [provider]  metoprolol tartrate (LOPRESSOR) 100 MG tablet Take 1 tablet (100 mg total) by mouth once for 1 dose. Please take one time dose 100mg  metoprolol tartrate 2 hr prior to cardiac CT for HR control IF HR >55bpm. 12/10/22 12/10/22  Debbe Odea, MD  minoxidil (LONITEN) 2.5 MG tablet 1/2 (HALF A TABLE) ORAL 2(TWO) TIMES A DAY    [provider]  naltrexone (DEPADE) 50 MG tablet Take 50 mg by mouth daily at 12 noon.  10/10/20   [provider]  predniSONE (STERAPRED UNI-PAK 21 TAB) 10 MG (21) TBPK tablet Take 6 tablets on day 1, 5 tablets day 2, 4 tablets day 3, 3 tablets day 4, 2 tablets day 5, 1 tablet day 6 08/08/23   [name], NP  risedronate (ACTONEL) 35 MG tablet Take 35 mg by mouth  every 7 (seven) days. with water on empty stomach, nothing by mouth or lie down for next 30 minutes.    [provider]    Family History Family History  Problem Relation Age of Onset   Diverticulitis Mother    Heart attack Father 40   Stroke Sister 21   Heart attack Brother 22    Social History Social History   Tobacco Use   Smoking status: Former    Current packs/day: 0.00    Average packs/day: 1 pack/day for 6.0 years (6.0 ttl pk-yrs)    Types: Cigarettes    Start date: 09/20/1970    Quit date: 09/20/1976    Years since quitting: 47.0   Smokeless tobacco: Never  Vaping Use   Vaping status: Never Used  Substance Use Topics    Alcohol use: Not Currently    Alcohol/week: 0.0 standard drinks of alcohol    Comment: occasionally- quit 03/18/20   Drug use: No     Allergies   Solifenacin   Review of Systems Review of Systems  Constitutional:  Negative for fever.  Eyes:  Positive for discharge and redness. Negative for photophobia, pain, itching and visual disturbance.  All other systems reviewed and are negative.    Physical Exam Triage Vital Signs ED Triage Vitals [09/09/23 1003]  Encounter Vitals Group     BP      Systolic BP Percentile      Diastolic BP Percentile      Pulse      Resp      Temp      Temp src      SpO2      Weight      Height      Head Circumference      Peak Flow      Pain Score 0     Pain Loc      Pain Education      Exclude from Growth Chart    No data found.  Updated Vital Signs BP 133/66   Pulse 65   Temp 98.2 F (36.8 C)   Resp 18   SpO2 98%   Visual Acuity Right Eye Distance: 20/50 (without glasses) Left Eye Distance: 20/30 (without glasses) Bilateral Distance: 20/20  Right Eye Near: R Near: 20/30 (With glasses) Left Eye Near:  L Near: 20/30 (with glasses) Bilateral Near:  20/20  Physical Exam Vitals and nursing note reviewed.  Eyes:     General: Lids are normal. Vision grossly intact.     Extraocular Movements: Extraocular movements intact.     Conjunctiva/sclera:     Right eye: Right conjunctiva is injected. Exudate present.     Left eye: Left conjunctiva is injected. Exudate present.     Pupils: Pupils are equal, round, and reactive to light.     Comments: Yellow exudate  Cardiovascular:     Rate and Rhythm: Normal rate.  Pulmonary:     Effort: Pulmonary effort is normal.  Musculoskeletal:     Cervical back: Normal range of motion.  Neurological:     General: No focal deficit present.     Mental Status: She is alert and oriented to person, place, and time.     GCS: GCS eye subscore is 4. GCS verbal subscore is 5. GCS motor subscore is 6.      Cranial Nerves: No cranial nerve deficit.     Sensory: No sensory deficit.  Psychiatric:        Attention and Perception: Attention  normal.        Mood and Affect: Mood normal.        Speech: Speech normal.        Behavior: Behavior normal.      UC Treatments / Results  Labs (all labs ordered are listed, but only abnormal results are displayed) Labs Reviewed - No data to display  EKG   Radiology No results found.  Procedures Procedures (including critical care time)  Medications Ordered in UC Medications - No data to display  Initial Impression / Assessment and Plan / UC Course  I have reviewed the triage vital signs and the nursing notes.  Pertinent labs & imaging results that were available during my care of the patient were reviewed by me and considered in my medical decision making (see chart for details).  Clinical Course as of 09/09/23 1035  Wed Sep 09, 2023  1018 Visual acuity with corrective lenses is 20/20 both eyes, right eye 20/30, left eye 20/50 Per charge nurse, Gunnar Fusi. [JD]    Clinical Course User Index [JD] Eden Rho, Para March, NP   Discussed exam findings and plan of care with patient, strict go to ER precautions given.   Patient verbalized understanding to this provider.  Ddx: Conjunctivitis(bacterial vs viral), allergies Final Clinical Impressions(s) / UC Diagnoses   Final diagnoses:  Acute bacterial conjunctivitis of both eyes     Discharge Instructions      Use warm moist cotton balls to clean eyes inner to outer area and then throw away,repeat as needed. Use eye ointment as directed. Follow up with your eye doctor, if you develop eye pain, loss of vision or worsening issues go immediately to the ER.      ED Prescriptions     Medication Sig Dispense Auth. Provider   erythromycin ophthalmic ointment Place a 1/2 inch ribbon of ointment into bilateral lower eyelid every 6 hours x 5 days 3.5 g Jong Rickman, NP      PDMP not  reviewed this encounter.   Clancy Gourd, NP 09/09/23 1035

## 2023-12-22 ENCOUNTER — Ambulatory Visit
Admission: EM | Admit: 2023-12-22 | Discharge: 2023-12-22 | Disposition: A | Discharge status: Home / Self Care | Payer: Medicare Other | Attending: Emergency Medicine | Admitting: Emergency Medicine

## 2023-12-22 ENCOUNTER — Encounter: Payer: Self-pay | Admitting: Emergency Medicine

## 2023-12-22 DIAGNOSIS — S61012A Laceration without foreign body of left thumb without damage to nail, initial encounter: Secondary | ICD-10-CM

## 2023-12-22 MED ORDER — LIDOCAINE HCL (PF) 1 % IJ SOLN: 10.0000 mL | Freq: Once | INTRAMUSCULAR | Status: DC

## 2023-12-22 MED ORDER — POVIDONE-IODINE 10 % EX SOLN: CUTANEOUS | Status: DC | PRN

## 2023-12-22 NOTE — ED Triage Notes (Signed)
Pt presents with a laceration to her left hand. She cut her hand with a knife trying to puncture a can.

## 2023-12-22 NOTE — Discharge Instructions (Signed)
Return in 10 to 12 days for suture removal, sooner if worse.  May take Tylenol as label directed for any discomfort or pain, make sure to elevate left thumb as you just had it sutured and it may throb on you once the lidocaine wears off.  Daily dressing changes.  Go to the emergency room if you have new or worsening issues or concerns.

## 2023-12-22 NOTE — ED Provider Notes (Signed)
[facility]    CSN: [id number] Arrival date & time: 12/22/23  0854      History   Chief Complaint Chief Complaint  Patient presents with   Laceration    HPI Mindy Howe is a 77 y.o. female.   77 year old female, Mindy Howe, presents to urgent care for evaluation of her left thumb.  Patient states she was cutting a can with her knife and punctured the can and punctured her hand.  Patient states tetanus shot is up-to-date.  The history is provided by the patient. No language interpreter was used.    Past Medical History:  Diagnosis Date   Glaucoma    Hyperlipidemia    Osteoporosis    SVT (supraventricular tachycardia) [facility])     Patient Active Problem List   Diagnosis Date Noted   Laceration of left thumb without foreign body without damage to nail 12/22/2023   Acute bacterial conjunctivitis of both eyes 09/09/2023   Acute sialoadenitis    Leucocytosis    Sepsis (HCC) 12/03/2020    Past Surgical History:  Procedure Laterality Date   CHOLECYSTECTOMY     ROTATOR CUFF REPAIR Right    TUBAL LIGATION     UMBILICAL HERNIA REPAIR      OB History   No obstetric history on file.      Home Medications    Prior to Admission medications   Medication Sig Start Date End Date Taking? Authorizing Provider  albuterol (VENTOLIN HFA) 108 (90 Base) MCG/ACT inhaler Inhale into the lungs. 02/11/23 02/11/24  [provider]  baclofen (LIORESAL) 10 MG tablet Take 1 tablet (10 mg total) by mouth 3 (three) times daily. 08/08/23   [name], NP  cholecalciferol (VITAMIN D) 1000 UNITS tablet Take 1,000 Units by mouth daily.    [provider]  EPINEPHrine 0.3 mg/0.3 mL IJ SOAJ injection Inject into the muscle. 02/21/22   [provider]  erythromycin ophthalmic ointment Place a 1/2 inch ribbon of ointment into bilateral lower eyelid every 6 hours x 5 days 09/09/23   [name], Para March, NP  ezetimibe-simvastatin (VYTORIN) 10-20 MG  tablet Take 1 tablet by mouth at bedtime. 07/10/23   [provider]  gabapentin (NEURONTIN) 100 MG capsule Take by mouth. 04/03/23 04/02/24  [provider]  HYDROcodone-acetaminophen (NORCO/VICODIN) 5-325 MG tablet Take 1 tablet by mouth 4 (four) times daily as needed.    [provider]  ipratropium (ATROVENT) 0.06 % nasal spray Place 2 sprays into both nostrils 4 (four) times daily. 01/20/23   [name], MD  latanoprost (XALATAN) 0.005 % ophthalmic solution Place 1 drop into both eyes at bedtime.     [provider]  metoprolol succinate (TOPROL-XL) 25 MG 24 hr tablet Take 0.5 tablets by mouth daily. 03/17/22   [provider]  metoprolol tartrate (LOPRESSOR) 100 MG tablet Take 1 tablet (100 mg total) by mouth once for 1 dose. Please take one time dose 100mg  metoprolol tartrate 2 hr prior to cardiac CT for HR control IF HR >55bpm. 12/10/22 12/10/22  Debbe Odea, MD  minoxidil (LONITEN) 2.5 MG tablet 1/2 (HALF A TABLE) ORAL 2(TWO) TIMES A DAY    [provider]  naltrexone (DEPADE) 50 MG tablet Take 50 mg by mouth daily at 12 noon.  10/10/20   [provider]  predniSONE (STERAPRED UNI-PAK 21 TAB) 10 MG (21) TBPK tablet Take 6 tablets on day 1, 5 tablets day 2, 4 tablets day 3, 3 tablets day 4, 2 tablets  day 5, 1 tablet day 6 08/08/23   [name], NP  risedronate (ACTONEL) 35 MG tablet Take 35 mg by mouth every 7 (seven) days. with water on empty stomach, nothing by mouth or lie down for next 30 minutes.    [provider]    Family History Family History  Problem Relation Age of Onset   Diverticulitis Mother    Heart attack Father 68   Stroke Sister 8   Heart attack Brother 60    Social History Social History   Tobacco Use   Smoking status: Former    Current packs/day: 0.00    Average packs/day: 1 pack/day for 6.0 years (6.0 ttl pk-yrs)    Types: Cigarettes    Start date: 09/20/1970    Quit date:  09/20/1976    Years since quitting: 47.2   Smokeless tobacco: Never  Vaping Use   Vaping status: Never Used  Substance Use Topics   Alcohol use: Not Currently    Alcohol/week: 0.0 standard drinks of alcohol    Comment: occasionally- quit 03/18/20   Drug use: No     Allergies   Solifenacin   Review of Systems Review of Systems  Constitutional:  Negative for fever.  Skin:  Positive for wound.  All other systems reviewed and are negative.    Physical Exam Triage Vital Signs ED Triage Vitals  Encounter Vitals Group     BP      Systolic BP Percentile      Diastolic BP Percentile      Pulse      Resp      Temp      Temp src      SpO2      Weight      Height      Head Circumference      Peak Flow      Pain Score      Pain Loc      Pain Education      Exclude from Growth Chart    No data found.  Updated Vital Signs BP (!) 142/75 (BP Location: Left Arm)   Pulse 75   Temp 98.1 F (36.7 C) (Oral)   Resp 16   SpO2 96%   Visual Acuity Right Eye Distance:   Left Eye Distance:   Bilateral Distance:    Right Eye Near:   Left Eye Near:    Bilateral Near:     Physical Exam Vitals and nursing note reviewed.  Skin:    Findings: Laceration and wound present.     Comments: Left thumb ~2 2 cm lacerations noted, bleeding controlled.  Neurological:     General: No focal deficit present.     Mental Status: She is alert and oriented to person, place, and time.     GCS: GCS eye subscore is 4. GCS verbal subscore is 5. GCS motor subscore is 6.     Cranial Nerves: No cranial nerve deficit.     Sensory: No sensory deficit.  Psychiatric:        Attention and Perception: Attention normal.        Mood and Affect: Mood normal.        Speech: Speech normal.      UC Treatments / Results  Labs (all labs ordered are listed, but only abnormal results are displayed) Labs Reviewed - No data to display  EKG   Radiology No results found.  Procedures Laceration  Repair  Date/Time: 12/22/2023 9:50 AM  Performed by: Clancy Gourd, NP Authorized by: Clancy Gourd, NP   Consent:    Consent obtained:  Verbal   Consent given by:  Patient   Risks, benefits, and alternatives were discussed: yes     Risks discussed:  Infection, need for additional repair, nerve damage, pain, poor cosmetic result, poor wound healing, tendon damage and vascular damage Universal protocol:    Procedure explained and questions answered to patient or proxy's satisfaction: yes     Relevant documents present and verified: yes     Site/side marked: yes     Immediately prior to procedure, a time out was called: yes     Patient identity confirmed:  Verbally with patient, arm band, provided demographic data and hospital-assigned identification number Anesthesia:    Anesthesia method:  Local infiltration   Local anesthetic:  Lidocaine 1% w/o epi Laceration details:    Location:  Finger   Finger location:  L thumb   Wound length (cm): 2 lacerations x 2cm for total of 4. Pre-procedure details:    Preparation:  Patient was prepped and draped in usual sterile fashion Exploration:    Limited defect created (wound extended): no     Hemostasis achieved with:  Direct pressure   Imaging outcome: foreign body not noted     Wound exploration: wound explored through full range of motion     Wound extent: no foreign bodies/material noted     Contaminated: no   Treatment:    Area cleansed with:  Povidone-iodine   Amount of cleaning:  Extensive   Irrigation solution:  Sterile water   Irrigation method:  Syringe   Visualized foreign bodies/material removed: no     Debridement:  None Skin repair:    Repair method:  Sutures   Suture size:  5-0   Suture material:  Nylon   Suture technique:  Simple interrupted (6) Approximation:    Approximation:  Close Repair type:    Repair type:  Simple Post-procedure details:    Dressing:  Antibiotic ointment, non-adherent dressing and  bulky dressing   Procedure completion:  Tolerated well, no immediate complications  (including critical care time)  Medications Ordered in UC Medications  lidocaine (PF) (XYLOCAINE) 1 % injection 10 mL (has no administration in time range)  povidone-iodine (BETADINE) 10 % external solution (has no administration in time range)    Initial Impression / Assessment and Plan / UC Course  I have reviewed the triage vital signs and the nursing notes.  Pertinent labs & imaging results that were available during my care of the patient were reviewed by me and considered in my medical decision making (see chart for details).    Discussed exam findings plan of care with patient patient return in 10 to 12 days for suture removal sooner if worse, strict go to ER precautions given, patient verbalized understanding this provider.  Ddx: Laceration of left thumb Final Clinical Impressions(s) / UC Diagnoses   Final diagnoses:  Laceration of left thumb without foreign body without damage to nail, initial encounter     Discharge Instructions      Return in 10 to 12 days for suture removal, sooner if worse.  May take Tylenol as label directed for any discomfort or pain, make sure to elevate left thumb as you just had it sutured and it may throb on you once the lidocaine wears off.  Daily dressing changes.  Go to the emergency room if you have new or worsening issues or concerns.  ED Prescriptions   None    PDMP not reviewed this encounter.   Clancy Gourd, NP 12/22/23 1150

## 2024-01-04 ENCOUNTER — Ambulatory Visit
Admission: RE | Admit: 2024-01-04 | Discharge: 2024-01-04 | Disposition: A | Discharge status: Home / Self Care | Payer: Medicare Other | Source: Ambulatory Visit | Attending: Family Medicine | Admitting: Family Medicine

## 2024-01-04 ENCOUNTER — Ambulatory Visit (INDEPENDENT_AMBULATORY_CARE_PROVIDER_SITE_OTHER): Payer: Medicare Other

## 2024-01-04 VITALS — BP 118/74 | HR 82 | Temp 98.3°F | Resp 16

## 2024-01-04 DIAGNOSIS — S61012D Laceration without foreign body of left thumb without damage to nail, subsequent encounter: Secondary | ICD-10-CM

## 2024-01-04 DIAGNOSIS — S60352A Superficial foreign body of left thumb, initial encounter: Secondary | ICD-10-CM | POA: Diagnosis not present

## 2024-01-04 MED ORDER — FLUCONAZOLE 150 MG PO TABS
150.0000 mg | ORAL_TABLET | Freq: Once | ORAL | 0 refills | Status: AC
Start: 2024-01-04 — End: 2024-01-04

## 2024-01-04 MED ORDER — DOXYCYCLINE HYCLATE 100 MG PO CAPS: 100.0000 mg | ORAL_CAPSULE | Freq: Two times a day (BID) | ORAL | 0 refills | Status: DC

## 2024-01-04 NOTE — Discharge Instructions (Addendum)
 Stop by the pharmacy to pick up your prescriptions.  On my review of your x-ray, I did not see any broken or dislocated bones.  If the radiologist finds something different, I will call you.  Follow-up with your primary care doctor to discuss finger hand surgeon, if pain not improving.  You do have a foreign body at the tip of your finger which she identifies as a broken needle from the ninth grade.  As it continues not to bother you, there is no follow-up needed for this.

## 2024-01-04 NOTE — ED Triage Notes (Signed)
 Pt presents for suture removal of her left thumb place on 12/22/23

## 2024-01-04 NOTE — ED Provider Notes (Signed)
 [facility]    CSN: [id number] Arrival date & time: 01/04/24  1125      History   Chief Complaint Chief Complaint  Patient presents with   Suture / Staple Removal    HPI Mindy Howe is a 78 y.o. female.   HPI  Mindy Howe presents for suture removal.  Asked by RN to see patient as her thumb is red and she is having pain.   No fever or difficulty moving her thumb.  Patient injured her left thumb with a knife while trying to open a can of cranberry sauce on Christmas Eve.  She notes the knife slipped and went through her thumb.  She came and had sutures placed at the urgent care.  No antibiotics were prescribed.    Past Medical History:  Diagnosis Date   Glaucoma    Hyperlipidemia    Osteoporosis    SVT (supraventricular tachycardia) [facility])     Patient Active Problem List   Diagnosis Date Noted   Laceration of left thumb without foreign body without damage to nail 12/22/2023   Acute bacterial conjunctivitis of both eyes 09/09/2023   Acute sialoadenitis    Leucocytosis    Sepsis (HCC) 12/03/2020    Past Surgical History:  Procedure Laterality Date   CHOLECYSTECTOMY     ROTATOR CUFF REPAIR Right    TUBAL LIGATION     UMBILICAL HERNIA REPAIR      OB History   No obstetric history on file.      Home Medications    Prior to Admission medications   Medication Sig Start Date End Date Taking? Authorizing Provider  doxycycline  (VIBRAMYCIN ) 100 MG capsule Take 1 capsule (100 mg total) by mouth 2 (two) times daily. 01/04/24  Yes Kaedyn Polivka, DO  fluconazole  (DIFLUCAN ) 150 MG tablet Take 1 tablet (150 mg total) by mouth once for 1 dose. 01/04/24 01/04/24 Yes Swan Fairfax, DO  albuterol (VENTOLIN HFA) 108 (90 Base) MCG/ACT inhaler Inhale into the lungs. 02/11/23 02/11/24  [provider]  baclofen  (LIORESAL ) 10 MG tablet Take 1 tablet (10 mg total) by mouth 3 (three) times daily. 08/08/23   Bernardino Ditch, NP  cholecalciferol  (VITAMIN D ) 1000  UNITS tablet Take 1,000 Units by mouth daily.    [provider]  EPINEPHrine 0.3 mg/0.3 mL IJ SOAJ injection Inject into the muscle. 02/21/22   [provider]  erythromycin  ophthalmic ointment Place a 1/2 inch ribbon of ointment into bilateral lower eyelid every 6 hours x 5 days 09/09/23   Defelice, Rilla, NP  ezetimibe -simvastatin  (VYTORIN ) 10-20 MG tablet Take 1 tablet by mouth at bedtime. 07/10/23   [provider]  gabapentin (NEURONTIN) 100 MG capsule Take by mouth. 04/03/23 04/02/24  [provider]  HYDROcodone-acetaminophen  (NORCO/VICODIN) 5-325 MG tablet Take 1 tablet by mouth 4 (four) times daily as needed.    [provider]  ipratropium (ATROVENT ) 0.06 % nasal spray Place 2 sprays into both nostrils 4 (four) times daily. 01/20/23   Mortenson, Ashley, MD  latanoprost  (XALATAN ) 0.005 % ophthalmic solution Place 1 drop into both eyes at bedtime.     [provider]  metoprolol  succinate (TOPROL -XL) 25 MG 24 hr tablet Take 0.5 tablets by mouth daily. 03/17/22   [provider]  metoprolol  tartrate (LOPRESSOR ) 100 MG tablet Take 1 tablet (100 mg total) by mouth once for 1 dose. Please take one time dose 100mg  metoprolol  tartrate 2 hr prior to cardiac CT for HR control IF HR >55bpm. 12/10/22  12/10/22  Darliss Rogue, MD  minoxidil (LONITEN) 2.5 MG tablet 1/2 (HALF A TABLE) ORAL 2(TWO) TIMES A DAY    [provider]  naltrexone (DEPADE) 50 MG tablet Take 50 mg by mouth daily at 12 noon.  10/10/20   [provider]  predniSONE  (STERAPRED UNI-PAK 21 TAB) 10 MG (21) TBPK tablet Take 6 tablets on day 1, 5 tablets day 2, 4 tablets day 3, 3 tablets day 4, 2 tablets day 5, 1 tablet day 6 08/08/23   Bernardino Ditch, NP  risedronate  (ACTONEL ) 35 MG tablet Take 35 mg by mouth every 7 (seven) days. with water on empty stomach, nothing by mouth or lie down for next 30 minutes.    [provider]    Family History Family  History  Problem Relation Age of Onset   Diverticulitis Mother    Heart attack Father 64   Stroke Sister 30   Heart attack Brother 29    Social History Social History   Tobacco Use   Smoking status: Former    Current packs/day: 0.00    Average packs/day: 1 pack/day for 6.0 years (6.0 ttl pk-yrs)    Types: Cigarettes    Start date: 09/20/1970    Quit date: 09/20/1976    Years since quitting: 47.3   Smokeless tobacco: Never  Vaping Use   Vaping status: Never Used  Substance Use Topics   Alcohol use: Not Currently    Alcohol/week: 0.0 standard drinks of alcohol    Comment: occasionally- quit 03/18/20   Drug use: No     Allergies   Solifenacin   Review of Systems Review of Systems :negative unless otherwise stated in HPI.      Physical Exam Triage Vital Signs ED Triage Vitals [01/04/24 1143]  Encounter Vitals Group     BP 118/74     Systolic BP Percentile      Diastolic BP Percentile      Pulse Rate 82     Resp 16     Temp 98.3 F (36.8 C)     Temp Source Oral     SpO2 98 %     Weight      Height      Head Circumference      Peak Flow      Pain Score      Pain Loc      Pain Education      Exclude from Growth Chart    No data found.  Updated Vital Signs BP 118/74 (BP Location: Left Arm)   Pulse 82   Temp 98.3 F (36.8 C) (Oral)   Resp 16   SpO2 98%   Visual Acuity Right Eye Distance:   Left Eye Distance:   Bilateral Distance:    Right Eye Near:   Left Eye Near:    Bilateral Near:     Physical Exam  GEN: alert, well appearing female, in no acute distress CV: regular rate, strong radial pulse. Midline surgical scar  RESP: no increased work of breathing MSK: Left  Hand: Inspection: No obvious deformity b/l. No swelling, erythema or bruising b/l Palpation: no TTP b/l ROM: No swelling in PIP, DIP joints b/l. Flexor digitorum profundus and superficialis tendon functions are intact.  PIP joint collateral ligaments are stable.  Full range of  motion of left thumb though tenderness at MCP joint  Strength: 5/5 strength in the forearm, wrist and interosseus muscles b/l Neurovascular: NV intact b/l SKIN: warm and dry; healing through  and through left thumb laceration, sutured removed prior to my evaluation     UC Treatments / Results  Labs (all labs ordered are listed, but only abnormal results are displayed) Labs Reviewed - No data to display  EKG   Radiology DG Finger Thumb Left Result Date: 01/04/2024 CLINICAL DATA:  knife trauma, pain at IP joint Patient presents for suture removal from left thumb placed 12/22/2023. Reported area of laceration at the medial junction of the thumb and 2nd fingers). EXAM: LEFT THUMB 2+V COMPARISON:  None Available. FINDINGS: The bones are demineralized. No evidence of acute fracture or dislocation. There are moderate degenerative changes at the 1st carpometacarpal joint. Evidence of previous distal radial plate and screw fixation, incompletely visualized. There is a small metallic foreign body within the distal soft tissues of the thumb. No foreign body or soft tissue emphysema identified within the 1st web space. IMPRESSION: No acute osseous findings. Small, age indeterminate metallic foreign body within the distal soft tissues of the thumb. Electronically Signed   By: Elsie Perone M.D.   On: 01/04/2024 13:31    Procedures Procedures (including critical care time)  Medications Ordered in UC Medications - No data to display  Initial Impression / Assessment and Plan / UC Course  I have reviewed the triage vital signs and the nursing notes.  Pertinent labs & imaging results that were available during my care of the patient were reviewed by me and considered in my medical decision making (see chart for details).     Patient is a 78 y.o. female who presents for left thumb suture removal.  Overall, patient is well-appearing and well-hydrated.  Vital signs stable.  Mindy Howe is afebrile. She has  pain at the MCP joint and TTP in the proximal thenar eminence.  Obtained left thumb x-ray personally reviewed by me showing foreign body in the distal tip but no foreign body in the area of concern.  There is no fracture or dislocated bones.  After reviewing x-ray with patient and she reports in the ninth grade she had a needle break off into her thumb by accident and notes she always wondered if a piece was left over.  Denies any pain in this area.  Patient aware the radiologist has not read her xray and is comfortable with the preliminary read by me. Will review radiologist read when available and call patient if a change in plan is warranted.  Pt agreeable to this plan prior to discharge.    She was not given antibiotics at her 12/22/2023 visit.  Treat with antibiotics to be sure that she does not have a thenar infection.  Prescription sent to the pharmacy as below.  Diflucan  prescribed as she has history of antibiotic associated yeast infections.  If pain does not resolve, she will follow-up with her primary care doctors for a referral to a [employer] hydrographic surveyor.   Reviewed expectations regarding course of current medical issues.  All questions asked were answered.  Outlined signs and symptoms indicating need for more acute intervention. Patient verbalized understanding. After Visit Summary given.  Radiologist impression reviewed.  Final Clinical Impressions(s) / UC Diagnoses   Final diagnoses:  Laceration of left thumb without foreign body without damage to nail, subsequent encounter  Foreign body of left thumb, initial encounter     Discharge Instructions      Stop by the pharmacy to pick up your prescriptions.  On my review of your x-ray, I did not see any broken or dislocated bones.  If the radiologist finds something different, I will call you.  Follow-up with your primary care doctor to discuss finger hand surgeon, if pain not improving.  You do have a foreign body at the tip of your  finger which she identifies as a broken needle from the ninth grade.  As it continues not to bother you, there is no follow-up needed for this.      ED Prescriptions     Medication Sig Dispense Auth. Provider   doxycycline  (VIBRAMYCIN ) 100 MG capsule Take 1 capsule (100 mg total) by mouth 2 (two) times daily. 14 capsule Waqas Bruhl, DO   fluconazole  (DIFLUCAN ) 150 MG tablet Take 1 tablet (150 mg total) by mouth once for 1 dose. 1 tablet Kriste Berth, DO      PDMP not reviewed this encounter.              Burns Timson, DO 01/04/24 1527

## 2024-08-28 ENCOUNTER — Ambulatory Visit
Admission: EM | Admit: 2024-08-28 | Discharge: 2024-08-28 | Disposition: A | Discharge status: Home / Self Care | Attending: Physician Assistant | Admitting: Physician Assistant

## 2024-08-28 ENCOUNTER — Encounter: Payer: Self-pay | Admitting: Emergency Medicine

## 2024-08-28 DIAGNOSIS — K649 Unspecified hemorrhoids: Secondary | ICD-10-CM | POA: Diagnosis not present

## 2024-08-28 DIAGNOSIS — R051 Acute cough: Secondary | ICD-10-CM

## 2024-08-28 DIAGNOSIS — U071 COVID-19: Secondary | ICD-10-CM

## 2024-08-28 MED ORDER — PAXLOVID (300/100) 20 X 150 MG & 10 X 100MG PO TBPK: 3.0000 | ORAL_TABLET | Freq: Two times a day (BID) | ORAL | 0 refills | Status: AC

## 2024-08-28 MED ORDER — LIDOCAINE (ANORECTAL) 5 % EX CREA
1.0000 | TOPICAL_CREAM | Freq: Four times a day (QID) | CUTANEOUS | 0 refills | Status: AC | PRN
Start: 2024-08-28 — End: ?

## 2024-08-28 MED ORDER — HYDROCORTISONE ACETATE 25 MG RE SUPP: 25.0000 mg | Freq: Two times a day (BID) | RECTAL | 0 refills | Status: AC

## 2024-08-28 MED ORDER — PROMETHAZINE-DM 6.25-15 MG/5ML PO SYRP: 5.0000 mL | ORAL_SOLUTION | Freq: Four times a day (QID) | ORAL | 0 refills | Status: DC | PRN

## 2024-08-28 NOTE — Discharge Instructions (Signed)
-   I sent Paxlovid  for cough.  Do not take your cholesterol statin medication for 10 days.  Make sure it has been 12 hours since the last dose of it before you start the Paxlovid . - Isolate 5 days from symptom onset and wear mask x 5 days. - Increase rest and fluids. - I sent cough medicine for you. - For the hemorrhoids I sent you a topical numbing cream and hydrocortisone  suppositories. - Consider stool softener and make sure not to push too hard when you are having bowel movements.

## 2024-08-28 NOTE — ED Triage Notes (Signed)
 Patient states that she started having cough and congestion on Thursday.  Patient states that she did a home COVID test and was positive.  Patient denies fevers.  Patient reports hemorroids and rectal discomfort for 2 weeks.  Patient denies any rectal bleeding or blood in her stools.

## 2024-08-28 NOTE — ED Provider Notes (Signed)
 [facility]    CSN: [id number] Arrival date & time: 08/28/24  1135      History   Chief Complaint Chief Complaint  Patient presents with   Covid Positive   Hemorrhoids    HPI Mindy Howe is a 78 y.o. female presenting for 3-day history of fatigue, cough, congestion and runny nose.  Denies fever, body aches, chest pain, shortness of breath, abdominal pain, vomiting or diarrhea.  Took a COVID test at home 2 days ago and says it was definitely positive.  She has had COVID twice before and once took Paxlovid  and would like to take that again.  Labs performed 3 months ago shows normal renal function.  Patient has taken over-the-counter cough medicine.  She is also reporting approximately 2-week history of internal and external hemorrhoids.  Has been using over-the-counter suppositories which have not really been helpful.  She denies any bleeding and does not have any significant pain when she has bowel movements.  Long history of hemorrhoids.  HPI  Past Medical History:  Diagnosis Date   Glaucoma    Hyperlipidemia    Osteoporosis    SVT (supraventricular tachycardia) [facility])     Patient Active Problem List   Diagnosis Date Noted   Laceration of left thumb without foreign body without damage to nail 12/22/2023   Acute bacterial conjunctivitis of both eyes 09/09/2023   Acute sialoadenitis    Leucocytosis    Sepsis (HCC) 12/03/2020    Past Surgical History:  Procedure Laterality Date   CHOLECYSTECTOMY     ROTATOR CUFF REPAIR Right    TUBAL LIGATION     UMBILICAL HERNIA REPAIR      OB History   No obstetric history on file.      Home Medications    Prior to Admission medications   Medication Sig Start Date End Date Taking? Authorizing Provider  hydrocortisone  (ANUSOL -HC) 25 MG suppository Place 1 suppository (25 mg total) rectally 2 (two) times daily. 08/28/24  Yes Arvis Jolan NOVAK, PA-C  Lidocaine , Anorectal, 5 % CREA Apply 1 Application  topically 4 (four) times daily as needed (rectal pain). 08/28/24  Yes Arvis Jolan NOVAK, PA-C  nirmatrelvir /ritonavir  (PAXLOVID , 300/100,) 20 x 150 MG & 10 x 100MG  TBPK Take 3 tablets by mouth 2 (two) times daily for 5 days. Patient GFR is 75. Take nirmatrelvir  (150 mg) two tablets twice daily for 5 days and ritonavir  (100 mg) one tablet twice daily for 5 days. 08/28/24 09/02/24 Yes Arvis Jolan NOVAK, PA-C  promethazine -dextromethorphan (PROMETHAZINE -DM) 6.25-15 MG/5ML syrup Take 5 mLs by mouth 4 (four) times daily as needed. 08/28/24  Yes Arvis Jolan B, PA-C  albuterol (VENTOLIN HFA) 108 (90 Base) MCG/ACT inhaler Inhale into the lungs. 02/11/23 02/11/24  [provider]  cholecalciferol  (VITAMIN D ) 1000 UNITS tablet Take 1,000 Units by mouth daily.    [provider]  EPINEPHrine 0.3 mg/0.3 mL IJ SOAJ injection Inject into the muscle. 02/21/22   [provider]  ezetimibe -simvastatin  (VYTORIN ) 10-20 MG tablet Take 1 tablet by mouth at bedtime. 07/10/23   [provider]  gabapentin (NEURONTIN) 100 MG capsule Take by mouth. 04/03/23 04/02/24  [provider]  HYDROcodone-acetaminophen  (NORCO/VICODIN) 5-325 MG tablet Take 1 tablet by mouth 4 (four) times daily as needed.    [provider]  latanoprost  (XALATAN ) 0.005 % ophthalmic solution Place 1 drop into both eyes at bedtime.     [provider]  metoprolol  succinate (TOPROL -XL) 25 MG 24 hr tablet Take  0.5 tablets by mouth daily. 03/17/22   [provider]  metoprolol  tartrate (LOPRESSOR ) 100 MG tablet Take 1 tablet (100 mg total) by mouth once for 1 dose. Please take one time dose 100mg  metoprolol  tartrate 2 hr prior to cardiac CT for HR control IF HR >55bpm. 12/10/22 12/10/22  Darliss Rogue, MD  minoxidil (LONITEN) 2.5 MG tablet 1/2 (HALF A TABLE) ORAL 2(TWO) TIMES A DAY    [provider]  naltrexone (DEPADE) 50 MG tablet Take 50 mg by mouth daily at 12 noon.  10/10/20    [provider]  risedronate  (ACTONEL ) 35 MG tablet Take 35 mg by mouth every 7 (seven) days. with water on empty stomach, nothing by mouth or lie down for next 30 minutes.    [provider]    Family History Family History  Problem Relation Age of Onset   Diverticulitis Mother    Heart attack Father 21   Stroke Sister 89   Heart attack Brother 58    Social History Social History   Tobacco Use   Smoking status: Former    Current packs/day: 0.00    Average packs/day: 1 pack/day for 6.0 years (6.0 ttl pk-yrs)    Types: Cigarettes    Start date: 09/20/1970    Quit date: 09/20/1976    Years since quitting: 47.9   Smokeless tobacco: Never  Vaping Use   Vaping status: Never Used  Substance Use Topics   Alcohol use: Not Currently    Alcohol/week: 0.0 standard drinks of alcohol    Comment: occasionally- quit 03/18/20   Drug use: No     Allergies   Solifenacin   Review of Systems Review of Systems  Constitutional:  Positive for fatigue. Negative for chills, diaphoresis and fever.  HENT:  Positive for congestion and rhinorrhea. Negative for ear pain, sinus pressure, sinus pain and sore throat.   Respiratory:  Positive for cough. Negative for shortness of breath.   Cardiovascular:  Negative for chest pain.  Gastrointestinal:  Positive for rectal pain (hemorrhoids). Negative for abdominal pain, anal bleeding, blood in stool, constipation, diarrhea, nausea and vomiting.  Musculoskeletal:  Negative for arthralgias and myalgias.  Skin:  Negative for rash.  Neurological:  Negative for weakness and headaches.  Hematological:  Negative for adenopathy.     Physical Exam Triage Vital Signs ED Triage Vitals  Encounter Vitals Group     BP 08/28/24 1155 (!) 151/80     Girls Systolic BP Percentile --      Girls Diastolic BP Percentile --      Boys Systolic BP Percentile --      Boys Diastolic BP Percentile --      Pulse Rate 08/28/24 1155 74     Resp 08/28/24  1155 14     Temp 08/28/24 1155 98.1 F (36.7 C)     Temp Source 08/28/24 1155 Oral     SpO2 08/28/24 1155 98 %     Weight 08/28/24 1153 115 lb 1.3 oz (52.2 kg)     Height 08/28/24 1153 5' 1.5 (1.562 m)     Head Circumference --      Peak Flow --      Pain Score 08/28/24 1153 1     Pain Loc --      Pain Education --      Exclude from Growth Chart --    No data found.  Updated Vital Signs BP (!) 151/80 (BP Location: Left Arm)   Pulse 74  Temp 98.1 F (36.7 C) (Oral)   Resp 14   Ht 5' 1.5 (1.562 m)   Wt 115 lb 1.3 oz (52.2 kg)   SpO2 98%   BMI 21.39 kg/m    Physical Exam Vitals and nursing note reviewed.  Constitutional:      General: She is not in acute distress.    Appearance: Normal appearance. She is not ill-appearing or toxic-appearing.  HENT:     Head: Normocephalic and atraumatic.     Nose: Congestion present.     Mouth/Throat:     Mouth: Mucous membranes are moist.     Pharynx: Oropharynx is clear.  Eyes:     General: No scleral icterus.       Right eye: No discharge.        Left eye: No discharge.     Conjunctiva/sclera: Conjunctivae normal.  Cardiovascular:     Rate and Rhythm: Normal rate and regular rhythm.     Heart sounds: Normal heart sounds.  Pulmonary:     Effort: Pulmonary effort is normal. No respiratory distress.     Breath sounds: Normal breath sounds.  Musculoskeletal:     Cervical back: Neck supple.  Skin:    General: Skin is dry.  Neurological:     General: No focal deficit present.     Mental Status: She is alert. Mental status is at baseline.     Motor: No weakness.     Gait: Gait normal.  Psychiatric:        Mood and Affect: Mood normal.        Behavior: Behavior normal.      UC Treatments / Results  Labs (all labs ordered are listed, but only abnormal results are displayed) Labs Reviewed - No data to display  EKG   Radiology No results found.  Procedures Procedures (including critical care time)  Medications  Ordered in UC Medications - No data to display  Initial Impression / Assessment and Plan / UC Course  I have reviewed the triage vital signs and the nursing notes.  Pertinent labs & imaging results that were available during my care of the patient were reviewed by me and considered in my medical decision making (see chart for details).   78 year old female presents for 2 separate complaints.  Reports fatigue, cough and congestion for the past 3 days.  Positive home COVID test.  Desires treatment with Paxlovid .  Has taken it before and done well.  Reviewed labs from 3 months ago which shows a GFR of 75.  On exam patient has nasal congestion.  Throat is clear.  Chest clear.  Heart regular rate and rhythm.  Sent Paxlovid  to pharmacy as well as Promethazine  DM.  Reviewed current CDC guidelines, isolation protocol and ED precautions for COVID.  For the hemorrhoids I sent Anusol  and topical lidocaine  cream.  Reviewed sitz bath's, stool softeners and close monitoring.  Advised to return if abdominal pain, difficulty having BMs, significant blood in stool.   Final Clinical Impressions(s) / UC Diagnoses   Final diagnoses:  COVID-19  Acute cough  Hemorrhoids, unspecified hemorrhoid type     Discharge Instructions      - I sent Paxlovid  for cough.  Do not take your cholesterol statin medication for 10 days.  Make sure it has been 12 hours since the last dose of it before you start the Paxlovid . - Isolate 5 days from symptom onset and wear mask x 5 days. - Increase rest and fluids. - I  sent cough medicine for you. - For the hemorrhoids I sent you a topical numbing cream and hydrocortisone  suppositories. - Consider stool softener and make sure not to push too hard when you are having bowel movements.     ED Prescriptions     Medication Sig Dispense Auth. Provider   nirmatrelvir /ritonavir  (PAXLOVID , 300/100,) 20 x 150 MG & 10 x 100MG  TBPK Take 3 tablets by mouth 2 (two) times daily for 5  days. Patient GFR is 75. Take nirmatrelvir  (150 mg) two tablets twice daily for 5 days and ritonavir  (100 mg) one tablet twice daily for 5 days. 30 tablet Arvis Huxley B, PA-C   hydrocortisone  (ANUSOL -HC) 25 MG suppository Place 1 suppository (25 mg total) rectally 2 (two) times daily. 12 suppository Arvis Huxley B, PA-C   Lidocaine , Anorectal, 5 % CREA Apply 1 Application topically 4 (four) times daily as needed (rectal pain). 38 g Arvis Huxley B, PA-C   promethazine -dextromethorphan (PROMETHAZINE -DM) 6.25-15 MG/5ML syrup Take 5 mLs by mouth 4 (four) times daily as needed. 118 mL Arvis Huxley NOVAK, PA-C      PDMP not reviewed this encounter.   Arvis Huxley NOVAK, PA-C 08/28/24 1247

## 2024-12-07 ENCOUNTER — Inpatient Hospital Stay: Admit: 2024-12-07 | Discharge: 2024-12-07 | Attending: Physician Assistant

## 2024-12-07 VITALS — BP 117/69 | HR 79 | Temp 98.0°F | Resp 16 | Wt 121.0 lb

## 2024-12-07 DIAGNOSIS — R300 Dysuria: Secondary | ICD-10-CM

## 2024-12-07 DIAGNOSIS — N3000 Acute cystitis without hematuria: Secondary | ICD-10-CM

## 2024-12-07 LAB — POCT URINE DIPSTICK
Bilirubin, UA: NEGATIVE
Glucose, UA: NEGATIVE mg/dL
Ketones, POC UA: NEGATIVE mg/dL
Nitrite, UA: POSITIVE — AB
Protein Ur, POC: 30 mg/dL — AB
Spec Grav, UA: 1.02 (ref 1.010–1.025)
Urobilinogen, UA: 0.2 U/dL
pH, UA: 7 (ref 5.0–8.0)

## 2024-12-07 MED ORDER — CEFDINIR 300 MG PO CAPS: 300.0000 mg | ORAL_CAPSULE | Freq: Two times a day (BID) | ORAL | 0 refills | Status: AC

## 2024-12-07 NOTE — Discharge Instructions (Signed)

## 2024-12-07 NOTE — ED Triage Notes (Signed)
 Pt presents with dysuria x 4 days. She has not taken anything for her symptoms.

## 2024-12-07 NOTE — ED Provider Notes (Signed)
 [facility]    CSN: [id number] Arrival date & time: 12/07/24  0944      History   Chief Complaint Chief Complaint  Patient presents with   Dysuria    HPI Mindy Howe is a 78 y.o. female with history of chronic lumbar radiculopathy (upcoming ESI 12/16/24), hyperlipidemia, osteoporosis, SVT, and glaucoma.   Patient presents for 4 day history of dysuria and reduced urine output. She believes she may have a UTI.   Denies fever, fatigue, abdominal pain, hematuria, urinary frequency/urgency, and flank pain.  HPI  Past Medical History:  Diagnosis Date   Glaucoma    Hyperlipidemia    Osteoporosis    SVT (supraventricular tachycardia)     Patient Active Problem List   Diagnosis Date Noted   Laceration of left thumb without foreign body without damage to nail 12/22/2023   Acute bacterial conjunctivitis of both eyes 09/09/2023   Acute sialoadenitis    Leucocytosis    Sepsis (HCC) 12/03/2020    Past Surgical History:  Procedure Laterality Date   CHOLECYSTECTOMY     ROTATOR CUFF REPAIR Right    TUBAL LIGATION     UMBILICAL HERNIA REPAIR      OB History   No obstetric history on file.      Home Medications    Prior to Admission medications   Medication Sig Start Date End Date Taking? Authorizing Provider  cefdinir (OMNICEF) 300 MG capsule Take 1 capsule (300 mg total) by mouth 2 (two) times daily for 7 days. 12/07/24 12/14/24 Yes Arvis Huxley B, PA-C  albuterol (VENTOLIN HFA) 108 (90 Base) MCG/ACT inhaler Inhale into the lungs. 02/11/23 02/11/24  [provider]  cholecalciferol  (VITAMIN D ) 1000 UNITS tablet Take 1,000 Units by mouth daily.    [provider]  EPINEPHrine 0.3 mg/0.3 mL IJ SOAJ injection Inject into the muscle. 02/21/22   [provider]  ezetimibe -simvastatin  (VYTORIN ) 10-20 MG tablet Take 1 tablet by mouth at bedtime. 07/10/23   [provider]  gabapentin (NEURONTIN) 100 MG capsule Take by  mouth. 04/03/23 04/02/24  [provider]  HYDROcodone-acetaminophen  (NORCO/VICODIN) 5-325 MG tablet Take 1 tablet by mouth 4 (four) times daily as needed.    [provider]  hydrocortisone  (ANUSOL -HC) 25 MG suppository Place 1 suppository (25 mg total) rectally 2 (two) times daily. 08/28/24   Arvis Huxley B, PA-C  latanoprost  (XALATAN ) 0.005 % ophthalmic solution Place 1 drop into both eyes at bedtime.     [provider]  Lidocaine , Anorectal, 5 % CREA Apply 1 Application topically 4 (four) times daily as needed (rectal pain). 08/28/24   Arvis Huxley NOVAK, PA-C  metoprolol  succinate (TOPROL -XL) 25 MG 24 hr tablet Take 0.5 tablets by mouth daily. 03/17/22   [provider]  metoprolol  tartrate (LOPRESSOR ) 100 MG tablet Take 1 tablet (100 mg total) by mouth once for 1 dose. Please take one time dose 100mg  metoprolol  tartrate 2 hr prior to cardiac CT for HR control IF HR >55bpm. 12/10/22 12/10/22  Darliss Rogue, MD  minoxidil (LONITEN) 2.5 MG tablet 1/2 (HALF A TABLE) ORAL 2(TWO) TIMES A DAY    [provider]  naltrexone (DEPADE) 50 MG tablet Take 50 mg by mouth daily at 12 noon.  10/10/20   [provider]  risedronate  (ACTONEL ) 35 MG tablet Take 35 mg by mouth every 7 (seven) days. with water on empty stomach, nothing by mouth or lie down for next 30 minutes.    [provider]  Family History Family History  Problem Relation Age of Onset   Diverticulitis Mother    Heart attack Father 47   Stroke Sister 44   Heart attack Brother 22    Social History Social History   Tobacco Use   Smoking status: Former    Current packs/day: 0.00    Average packs/day: 1 pack/day for 6.0 years (6.0 ttl pk-yrs)    Types: Cigarettes    Start date: 09/20/1970    Quit date: 09/20/1976    Years since quitting: 48.2   Smokeless tobacco: Never  Vaping Use   Vaping status: Never Used  Substance Use Topics   Alcohol use: Not Currently     Alcohol/week: 0.0 standard drinks of alcohol    Comment: occasionally- quit 03/18/20   Drug use: No     Allergies   Solifenacin   Review of Systems Review of Systems  Constitutional:  Negative for chills, fatigue and fever.  Gastrointestinal:  Negative for abdominal pain, diarrhea, nausea and vomiting.  Genitourinary:  Positive for decreased urine volume and dysuria. Negative for flank pain, frequency, hematuria, pelvic pain, urgency, vaginal bleeding, vaginal discharge and vaginal pain.  Musculoskeletal:  Positive for back pain (chronic. upcoming ESI 12/16/24).  Skin:  Negative for rash.     Physical Exam Triage Vital Signs ED Triage Vitals  Encounter Vitals Group     BP      Girls Systolic BP Percentile      Girls Diastolic BP Percentile      Boys Systolic BP Percentile      Boys Diastolic BP Percentile      Pulse      Resp      Temp      Temp src      SpO2      Weight      Height      Head Circumference      Peak Flow      Pain Score      Pain Loc      Pain Education      Exclude from Growth Chart    No data found.  Updated Vital Signs BP 117/69 (BP Location: Left Arm)   Pulse 79   Temp 98 F (36.7 C) (Oral)   Resp 16   Wt 121 lb (54.9 kg)   SpO2 96%   BMI 22.49 kg/m      Physical Exam Vitals and nursing note reviewed.  Constitutional:      General: She is not in acute distress.    Appearance: Normal appearance. She is not ill-appearing or toxic-appearing.  HENT:     Head: Normocephalic and atraumatic.  Eyes:     General: No scleral icterus.       Right eye: No discharge.        Left eye: No discharge.     Conjunctiva/sclera: Conjunctivae normal.  Cardiovascular:     Rate and Rhythm: Normal rate and regular rhythm.     Heart sounds: Normal heart sounds.  Pulmonary:     Effort: Pulmonary effort is normal. No respiratory distress.     Breath sounds: Normal breath sounds.  Abdominal:     Palpations: Abdomen is soft.     Tenderness: There is  abdominal tenderness (mild periumbilical). There is no right CVA tenderness or left CVA tenderness.  Musculoskeletal:     Cervical back: Neck supple.  Skin:    General: Skin is dry.  Neurological:     General: No focal  deficit present.     Mental Status: She is alert. Mental status is at baseline.     Motor: No weakness.     Gait: Gait normal.  Psychiatric:        Mood and Affect: Mood normal.        Behavior: Behavior normal.      UC Treatments / Results  Labs (all labs ordered are listed, but only abnormal results are displayed) Labs Reviewed  POCT URINE DIPSTICK - Abnormal; Notable for the following components:      Result Value   Clarity, UA cloudy (*)    Blood, UA trace-intact (*)    Protein Ur, POC =30 (*)    Nitrite, UA Positive (*)    Leukocytes, UA Small (1+) (*)    All other components within normal limits  URINE CULTURE    EKG   Radiology No results found.  Procedures Procedures (including critical care time)  Medications Ordered in UC Medications - No data to display  Initial Impression / Assessment and Plan / UC Course  I have reviewed the triage vital signs and the nursing notes.  Pertinent labs & imaging results that were available during my care of the patient were reviewed by me and considered in my medical decision making (see chart for details).   78 year old female presents for 4-day history of dysuria and reduced urine output. No fever, flank pain or abdominal pain.  Vital stable.  Overall well-appearing.  No acute distress.  Mild periumbilical tenderness. No CVA tenderness.  Urinalysis obtained.  Urine is cloudy with trace RBCs, protein, positive nitrites and small leukocytes.  Will send for culture  Discussed UA results with patient.  Advise she has a urinary tract infection.  Encouraged increasing rest and fluids.  Sent cefdinir.  May amend treatment based on culture results.  Reviewed return precautions.   Final Clinical  Impressions(s) / UC Diagnoses   Final diagnoses:  Dysuria  Acute cystitis without hematuria     Discharge Instructions      UTI: Based on either symptoms or urinalysis, you may have a urinary tract infection. We will send the urine for culture and call with results in a few days. Begin antibiotics at this time. Your symptoms should be much improved over the next 2-3 days. Increase rest and fluid intake. If for some reason symptoms are worsening or not improving after a couple of days or the urine culture determines the antibiotics you are taking will not treat the infection, the antibiotics may be changed. Return or go to ER for fever, back pain, worsening urinary pain, discharge, increased blood in urine. May take Tylenol  or Motrin OTC for pain relief or consider AZO if no contraindications      ED Prescriptions     Medication Sig Dispense Auth. Provider   cefdinir (OMNICEF) 300 MG capsule Take 1 capsule (300 mg total) by mouth 2 (two) times daily for 7 days. 14 capsule Arvis Jolan NOVAK, PA-C      PDMP not reviewed this encounter.   Arvis Jolan NOVAK, PA-C 12/07/24 1032

## 2024-12-09 ENCOUNTER — Ambulatory Visit (HOSPITAL_COMMUNITY): Payer: Self-pay

## 2024-12-09 LAB — URINE CULTURE: Culture: >=100000 — AB

## 2024-12-09 NOTE — Telephone Encounter (Signed)
 Pt returned call. Reports symptoms are improving. Advised of results. Verbalized understanding to continue abx and to f/u as needed.
